# Patient Record
Sex: Male | Born: 1985
Health system: Southern US, Community
[De-identification: ages and names within clinical notes are randomized; demographics above are authoritative.]

## PROBLEM LIST (undated history)

## (undated) DIAGNOSIS — R011 Cardiac murmur, unspecified: Secondary | ICD-10-CM

## (undated) DIAGNOSIS — F988 Other specified behavioral and emotional disorders with onset usually occurring in childhood and adolescence: Secondary | ICD-10-CM

## (undated) DIAGNOSIS — G43909 Migraine, unspecified, not intractable, without status migrainosus: Secondary | ICD-10-CM

## (undated) DIAGNOSIS — Z87442 Personal history of urinary calculi: Secondary | ICD-10-CM

## (undated) DIAGNOSIS — J45909 Unspecified asthma, uncomplicated: Secondary | ICD-10-CM

## (undated) DIAGNOSIS — N200 Calculus of kidney: Secondary | ICD-10-CM

## (undated) HISTORY — PX: LITHOTRIPSY: SUR834

## (undated) HISTORY — PX: KIDNEY STONE SURGERY: SHX686

---
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
## 2004-09-26 ENCOUNTER — Emergency Department (HOSPITAL_COMMUNITY): Admission: EM | Admit: 2004-09-26 | Discharge: 2004-09-27 | Payer: Self-pay | Admitting: Emergency Medicine

## 2004-09-28 ENCOUNTER — Emergency Department (HOSPITAL_COMMUNITY): Admission: EM | Admit: 2004-09-28 | Discharge: 2004-09-28 | Payer: Self-pay | Admitting: Emergency Medicine

## 2013-04-29 DIAGNOSIS — J45909 Unspecified asthma, uncomplicated: Secondary | ICD-10-CM | POA: Insufficient documentation

## 2013-04-29 DIAGNOSIS — F988 Other specified behavioral and emotional disorders with onset usually occurring in childhood and adolescence: Secondary | ICD-10-CM | POA: Insufficient documentation

## 2016-09-15 DIAGNOSIS — R112 Nausea with vomiting, unspecified: Secondary | ICD-10-CM | POA: Diagnosis not present

## 2016-09-15 DIAGNOSIS — N1330 Unspecified hydronephrosis: Secondary | ICD-10-CM | POA: Diagnosis not present

## 2016-09-15 DIAGNOSIS — N132 Hydronephrosis with renal and ureteral calculous obstruction: Secondary | ICD-10-CM | POA: Diagnosis not present

## 2016-09-15 DIAGNOSIS — N201 Calculus of ureter: Secondary | ICD-10-CM | POA: Diagnosis not present

## 2016-09-15 DIAGNOSIS — Z79899 Other long term (current) drug therapy: Secondary | ICD-10-CM | POA: Diagnosis not present

## 2016-09-15 DIAGNOSIS — N23 Unspecified renal colic: Secondary | ICD-10-CM | POA: Diagnosis not present

## 2016-09-15 DIAGNOSIS — Z87891 Personal history of nicotine dependence: Secondary | ICD-10-CM | POA: Diagnosis not present

## 2016-09-15 DIAGNOSIS — Z880 Allergy status to penicillin: Secondary | ICD-10-CM | POA: Diagnosis not present

## 2016-09-15 DIAGNOSIS — K2970 Gastritis, unspecified, without bleeding: Secondary | ICD-10-CM | POA: Diagnosis not present

## 2016-09-15 DIAGNOSIS — E876 Hypokalemia: Secondary | ICD-10-CM | POA: Diagnosis not present

## 2016-09-29 DIAGNOSIS — N202 Calculus of kidney with calculus of ureter: Secondary | ICD-10-CM | POA: Diagnosis not present

## 2016-10-01 DIAGNOSIS — Z880 Allergy status to penicillin: Secondary | ICD-10-CM | POA: Diagnosis not present

## 2016-10-01 DIAGNOSIS — Z79899 Other long term (current) drug therapy: Secondary | ICD-10-CM | POA: Diagnosis not present

## 2016-10-01 DIAGNOSIS — J45909 Unspecified asthma, uncomplicated: Secondary | ICD-10-CM | POA: Diagnosis not present

## 2016-10-01 DIAGNOSIS — N201 Calculus of ureter: Secondary | ICD-10-CM | POA: Diagnosis not present

## 2016-10-01 DIAGNOSIS — Z466 Encounter for fitting and adjustment of urinary device: Secondary | ICD-10-CM | POA: Diagnosis not present

## 2016-10-01 DIAGNOSIS — Z79891 Long term (current) use of opiate analgesic: Secondary | ICD-10-CM | POA: Diagnosis not present

## 2016-10-04 DIAGNOSIS — R109 Unspecified abdominal pain: Secondary | ICD-10-CM | POA: Diagnosis not present

## 2016-10-04 DIAGNOSIS — Z79899 Other long term (current) drug therapy: Secondary | ICD-10-CM | POA: Diagnosis not present

## 2016-10-04 DIAGNOSIS — R300 Dysuria: Secondary | ICD-10-CM | POA: Diagnosis not present

## 2016-10-04 DIAGNOSIS — Z880 Allergy status to penicillin: Secondary | ICD-10-CM | POA: Diagnosis not present

## 2016-10-04 DIAGNOSIS — Z87891 Personal history of nicotine dependence: Secondary | ICD-10-CM | POA: Diagnosis not present

## 2016-10-06 DIAGNOSIS — N201 Calculus of ureter: Secondary | ICD-10-CM | POA: Diagnosis not present

## 2017-01-05 DIAGNOSIS — Z0389 Encounter for observation for other suspected diseases and conditions ruled out: Secondary | ICD-10-CM | POA: Diagnosis not present

## 2017-01-05 DIAGNOSIS — R109 Unspecified abdominal pain: Secondary | ICD-10-CM | POA: Diagnosis not present

## 2017-01-05 DIAGNOSIS — R1084 Generalized abdominal pain: Secondary | ICD-10-CM | POA: Diagnosis not present

## 2017-01-05 DIAGNOSIS — N23 Unspecified renal colic: Secondary | ICD-10-CM | POA: Diagnosis not present

## 2017-01-05 DIAGNOSIS — N200 Calculus of kidney: Secondary | ICD-10-CM | POA: Diagnosis not present

## 2017-01-08 DIAGNOSIS — N200 Calculus of kidney: Secondary | ICD-10-CM | POA: Diagnosis not present

## 2017-01-08 DIAGNOSIS — Z880 Allergy status to penicillin: Secondary | ICD-10-CM | POA: Diagnosis not present

## 2017-01-08 DIAGNOSIS — Z79899 Other long term (current) drug therapy: Secondary | ICD-10-CM | POA: Diagnosis not present

## 2017-06-10 DIAGNOSIS — R109 Unspecified abdominal pain: Secondary | ICD-10-CM | POA: Diagnosis not present

## 2017-06-10 DIAGNOSIS — N200 Calculus of kidney: Secondary | ICD-10-CM | POA: Diagnosis not present

## 2017-06-10 DIAGNOSIS — R1111 Vomiting without nausea: Secondary | ICD-10-CM | POA: Diagnosis not present

## 2017-06-10 DIAGNOSIS — R1084 Generalized abdominal pain: Secondary | ICD-10-CM | POA: Diagnosis not present

## 2017-06-25 DIAGNOSIS — N23 Unspecified renal colic: Secondary | ICD-10-CM | POA: Diagnosis not present

## 2017-06-25 DIAGNOSIS — R109 Unspecified abdominal pain: Secondary | ICD-10-CM | POA: Diagnosis not present

## 2017-06-29 ENCOUNTER — Emergency Department (HOSPITAL_BASED_OUTPATIENT_CLINIC_OR_DEPARTMENT_OTHER): Payer: BLUE CROSS/BLUE SHIELD

## 2017-06-29 ENCOUNTER — Encounter (HOSPITAL_BASED_OUTPATIENT_CLINIC_OR_DEPARTMENT_OTHER): Payer: Self-pay | Admitting: Emergency Medicine

## 2017-06-29 ENCOUNTER — Emergency Department (HOSPITAL_BASED_OUTPATIENT_CLINIC_OR_DEPARTMENT_OTHER)
Admission: EM | Admit: 2017-06-29 | Discharge: 2017-06-29 | Disposition: A | Discharge status: Home / Self Care | Payer: BLUE CROSS/BLUE SHIELD | Attending: Emergency Medicine | Admitting: Emergency Medicine

## 2017-06-29 ENCOUNTER — Other Ambulatory Visit: Payer: Self-pay

## 2017-06-29 DIAGNOSIS — Z87891 Personal history of nicotine dependence: Secondary | ICD-10-CM | POA: Insufficient documentation

## 2017-06-29 DIAGNOSIS — N200 Calculus of kidney: Secondary | ICD-10-CM | POA: Diagnosis not present

## 2017-06-29 DIAGNOSIS — R109 Unspecified abdominal pain: Secondary | ICD-10-CM

## 2017-06-29 DIAGNOSIS — R1011 Right upper quadrant pain: Secondary | ICD-10-CM | POA: Insufficient documentation

## 2017-06-29 DIAGNOSIS — M549 Dorsalgia, unspecified: Secondary | ICD-10-CM | POA: Diagnosis not present

## 2017-06-29 HISTORY — DX: Calculus of kidney: N20.0

## 2017-06-29 LAB — URINALYSIS, ROUTINE W REFLEX MICROSCOPIC
Bilirubin Urine: NEGATIVE
Glucose, UA: NEGATIVE mg/dL
Hgb urine dipstick: NEGATIVE
Ketones, ur: NEGATIVE mg/dL
Leukocytes, UA: NEGATIVE
Nitrite: NEGATIVE
Protein, ur: NEGATIVE mg/dL
Specific Gravity, Urine: 1.02 (ref 1.005–1.030)
pH: 6 (ref 5.0–8.0)

## 2017-06-29 LAB — CBC WITH DIFFERENTIAL/PLATELET
BASOS ABS: 0.1 10*3/uL (ref 0.0–0.1)
Basophils Relative: 2 %
Eosinophils Absolute: 0.4 10*3/uL (ref 0.0–0.7)
Eosinophils Relative: 11 %
HEMATOCRIT: 41.1 % (ref 39.0–52.0)
Hemoglobin: 14.1 g/dL (ref 13.0–17.0)
LYMPHS ABS: 1.5 10*3/uL (ref 0.7–4.0)
LYMPHS PCT: 43 %
MCH: 29.8 pg (ref 26.0–34.0)
MCHC: 34.3 g/dL (ref 30.0–36.0)
MCV: 86.9 fL (ref 78.0–100.0)
MONO ABS: 0.3 10*3/uL (ref 0.1–1.0)
Monocytes Relative: 10 %
NEUTROS ABS: 1.2 10*3/uL — AB (ref 1.7–7.7)
Neutrophils Relative %: 34 %
Platelets: 250 10*3/uL (ref 150–400)
RBC: 4.73 MIL/uL (ref 4.22–5.81)
RDW: 13.1 % (ref 11.5–15.5)
WBC: 3.4 10*3/uL — AB (ref 4.0–10.5)

## 2017-06-29 LAB — COMPREHENSIVE METABOLIC PANEL
ALT: 27 U/L (ref 17–63)
AST: 30 U/L (ref 15–41)
Albumin: 4.5 g/dL (ref 3.5–5.0)
Alkaline Phosphatase: 57 U/L (ref 38–126)
Anion gap: 7 (ref 5–15)
BUN: 14 mg/dL (ref 6–20)
CO2: 26 mmol/L (ref 22–32)
Calcium: 9.5 mg/dL (ref 8.9–10.3)
Chloride: 105 mmol/L (ref 101–111)
Creatinine, Ser: 0.81 mg/dL (ref 0.61–1.24)
GFR calc Af Amer: >60 mL/min (ref >60)
GFR calc non Af Amer: >60 mL/min (ref >60)
Glucose, Bld: 85 mg/dL (ref 65–99)
Potassium: 4.2 mmol/L (ref 3.5–5.1)
Sodium: 138 mmol/L (ref 135–145)
Total Bilirubin: 0.4 mg/dL (ref 0.3–1.2)
Total Protein: 7.5 g/dL (ref 6.5–8.1)

## 2017-06-29 LAB — LIPASE, BLOOD: LIPASE: 111 U/L — AB (ref 11–51)

## 2017-06-29 MED ORDER — IOPAMIDOL (ISOVUE-300) INJECTION 61%
100.0000 mL | Freq: Once | INTRAVENOUS | Status: AC | PRN
Start: 2017-06-29 — End: 2017-06-29
  Administered 2017-06-29: 100 mL via INTRAVENOUS

## 2017-06-29 MED ORDER — SODIUM CHLORIDE 0.9 % IV SOLN
INTRAVENOUS | Status: DC
  Administered 2017-06-29: 11:00:00 via INTRAVENOUS

## 2017-06-29 NOTE — Discharge Instructions (Signed)
Extensive workup for the right flank pain right CVA pain without any acute findings.  May be musculoskeletal in nature particularly by her history.  Recommend 1 week course of a medication like Naprosyn or Motrin.  Follow-up with your primary care doctor.  Return for any new or worse symptoms.

## 2017-06-29 NOTE — ED Provider Notes (Signed)
[facility] EMERGENCY DEPARTMENT Provider Note   CSN: [id number] Arrival date & time: 06/29/17  1014     History   Chief Complaint Chief Complaint  Patient presents with  . Flank Pain    HPI Worthy FlankDerek G Cisneros is a 32 y.o. male.  Patient with a complaint of right flank pain since Christmas time.  Has had a history of kidney stones in the past.  Evaluated by urology with CT renal study several weeks ago.  Without any evidence of any ureter stones.  Went to a walk-in clinic yesterday and they were concerned about gallbladder and recommended ultrasound of the gallbladder.  Patient states the pain is intermittent right flank right CVA sometimes right upper quadrant made worse by walking and made worse by sitting in the car.  Food has no effect on it.  No nausea no vomiting no diarrhea no fevers.  No history of similar pain prior to Christmas.      Past Medical History:  Diagnosis Date  . Kidney stones     There are no active problems to display for this patient.   Past Surgical History:  Procedure Laterality Date  . LITHOTRIPSY         Home Medications    Prior to Admission medications   Not on File    Family History No family history on file.  Social History Social History   Tobacco Use  . Smoking status: Former Games developermoker  . Smokeless tobacco: Never Used  Substance Use Topics  . Alcohol use: Yes    Frequency: Never    Comment: occ  . Drug use: No     Allergies   Amoxicillin and Penicillins   Review of Systems Review of Systems  Constitutional: Negative for fever.  HENT: Negative for congestion.   Eyes: Negative for redness.  Respiratory: Negative for shortness of breath.   Cardiovascular: Negative for chest pain.  Gastrointestinal: Positive for abdominal pain.  Genitourinary: Positive for flank pain. Negative for dysuria and hematuria.  Musculoskeletal: Positive for back pain.  Skin: Negative for rash.  Neurological: Negative for  headaches.  Hematological: Does not bruise/bleed easily.  Psychiatric/Behavioral: Negative for confusion.     Physical Exam Updated Vital Signs BP (!) 130/93 (BP Location: Right Arm)   Pulse 64   Temp 97.8 F (36.6 C) (Oral)   Resp 16   Ht 1.753 m (5\' 9" )   Wt 56 kg (123 lb 8 oz)   SpO2 100%   BMI 18.24 kg/m   Physical Exam  Constitutional: He is oriented to person, place, and time. He appears well-developed and well-nourished. No distress.  HENT:  Head: Normocephalic and atraumatic.  Mouth/Throat: Oropharynx is clear and moist.  Eyes: Conjunctivae and EOM are normal. Pupils are equal, round, and reactive to light.  Neck: Normal range of motion. Neck supple.  Cardiovascular: Normal rate, regular rhythm and normal heart sounds.  Pulmonary/Chest: Effort normal and breath sounds normal. No respiratory distress.  Abdominal: Soft. Bowel sounds are normal. There is no tenderness.  Musculoskeletal: Normal range of motion. He exhibits no tenderness.  No CVA tenderness no tenderness to palpation of low back.  Neurological: He is alert and oriented to person, place, and time. No cranial nerve deficit or sensory deficit. He exhibits normal muscle tone. Coordination normal.  Skin: Skin is warm. No rash noted.  Nursing note and vitals reviewed.    ED Treatments / Results  Labs (all labs ordered are listed, but only abnormal results are  displayed) Labs Reviewed  URINALYSIS, ROUTINE W REFLEX MICROSCOPIC  COMPREHENSIVE METABOLIC PANEL  LIPASE, BLOOD  CBC WITH DIFFERENTIAL/PLATELET    EKG  EKG Interpretation None       Radiology No results found.  Procedures Procedures (including critical care time)  Medications Ordered in ED Medications  0.9 %  sodium chloride infusion (not administered)     Initial Impression / Assessment and Plan / ED Course  I have reviewed the triage vital signs and the nursing notes.  Pertinent labs & imaging results that were available  during my care of the patient were reviewed by me and considered in my medical decision making (see chart for details).     Patient around Christmas time had CT renal study not showing any ureteral stones.  Does have a history of stones up in the kidney area.  Patient with persistent right flank right upper quadrant right CVA pain.  May be musculoskeletal in nature.  Patient went to walk-in clinic and they wanted to rule out gallbladder problems.  Symptoms not very consistent with gallbladder however we will go ahead and do CT abdomen pelvis with contrast here to further evaluate the entire area.  Extensive workup here to include CT of the abdomen.  Gallbladder very normal.  Nothing to explain the pain.  Patient's lipase was elevated on basic labs but CT shows no inflammation or abnormality patient symptoms are very muscular by history.  Would recommend a course of Motrin or Naprosyn for the next 7 days.  Patient has follow-up with a new primary care doctor available.  No evidence of ureteral stones no real GI symptoms associated with the pain.  Final Clinical Impressions(s) / ED Diagnoses   Final diagnoses:  None    ED Discharge Orders    None       Vanetta Mulders, MD 06/29/17 1252

## 2017-06-29 NOTE — ED Triage Notes (Signed)
Pain in right flank and RLQ for a month and a half.  Has seen urology.  Had CT.  Was told he had 2 small - 2mm- stones.  One in each kidney that "should not be hurting like this."  [facility] was told he needs an US to check his gall bladder.

## 2017-08-14 DIAGNOSIS — N200 Calculus of kidney: Secondary | ICD-10-CM | POA: Diagnosis not present

## 2017-08-14 DIAGNOSIS — M5431 Sciatica, right side: Secondary | ICD-10-CM | POA: Diagnosis not present

## 2017-11-13 DIAGNOSIS — F900 Attention-deficit hyperactivity disorder, predominantly inattentive type: Secondary | ICD-10-CM | POA: Diagnosis not present

## 2018-01-28 ENCOUNTER — Encounter (HOSPITAL_BASED_OUTPATIENT_CLINIC_OR_DEPARTMENT_OTHER): Payer: Self-pay | Admitting: Emergency Medicine

## 2018-01-28 ENCOUNTER — Other Ambulatory Visit: Payer: Self-pay

## 2018-01-28 ENCOUNTER — Emergency Department (HOSPITAL_BASED_OUTPATIENT_CLINIC_OR_DEPARTMENT_OTHER)
Admission: EM | Admit: 2018-01-28 | Discharge: 2018-01-28 | Disposition: A | Discharge status: Home / Self Care | Payer: BLUE CROSS/BLUE SHIELD | Attending: Emergency Medicine | Admitting: Emergency Medicine

## 2018-01-28 ENCOUNTER — Emergency Department (HOSPITAL_BASED_OUTPATIENT_CLINIC_OR_DEPARTMENT_OTHER): Payer: BLUE CROSS/BLUE SHIELD

## 2018-01-28 DIAGNOSIS — Z79899 Other long term (current) drug therapy: Secondary | ICD-10-CM | POA: Insufficient documentation

## 2018-01-28 DIAGNOSIS — R109 Unspecified abdominal pain: Secondary | ICD-10-CM

## 2018-01-28 DIAGNOSIS — R110 Nausea: Secondary | ICD-10-CM | POA: Diagnosis not present

## 2018-01-28 DIAGNOSIS — F17200 Nicotine dependence, unspecified, uncomplicated: Secondary | ICD-10-CM | POA: Insufficient documentation

## 2018-01-28 DIAGNOSIS — R3915 Urgency of urination: Secondary | ICD-10-CM | POA: Diagnosis not present

## 2018-01-28 DIAGNOSIS — R1031 Right lower quadrant pain: Secondary | ICD-10-CM | POA: Diagnosis not present

## 2018-01-28 HISTORY — DX: Other specified behavioral and emotional disorders with onset usually occurring in childhood and adolescence: F98.8

## 2018-01-28 LAB — CBC WITH DIFFERENTIAL/PLATELET
BASOS PCT: 1 %
Basophils Absolute: 0.1 10*3/uL (ref 0.0–0.1)
EOS ABS: 0.2 10*3/uL (ref 0.0–0.7)
EOS PCT: 5 %
HCT: 41.4 % (ref 39.0–52.0)
HEMOGLOBIN: 14.1 g/dL (ref 13.0–17.0)
LYMPHS ABS: 1.4 10*3/uL (ref 0.7–4.0)
Lymphocytes Relative: 31 %
MCH: 29.6 pg (ref 26.0–34.0)
MCHC: 34.1 g/dL (ref 30.0–36.0)
MCV: 87 fL (ref 78.0–100.0)
MONOS PCT: 7 %
Monocytes Absolute: 0.3 10*3/uL (ref 0.1–1.0)
NEUTROS PCT: 56 %
Neutro Abs: 2.5 10*3/uL (ref 1.7–7.7)
PLATELETS: 238 10*3/uL (ref 150–400)
RBC: 4.76 MIL/uL (ref 4.22–5.81)
RDW: 13.2 % (ref 11.5–15.5)
WBC: 4.5 10*3/uL (ref 4.0–10.5)

## 2018-01-28 LAB — URINALYSIS, ROUTINE W REFLEX MICROSCOPIC
BILIRUBIN URINE: NEGATIVE
Glucose, UA: NEGATIVE mg/dL
HGB URINE DIPSTICK: NEGATIVE
KETONES UR: NEGATIVE mg/dL
Leukocytes, UA: NEGATIVE
NITRITE: NEGATIVE
Protein, ur: NEGATIVE mg/dL
Specific Gravity, Urine: 1.02 (ref 1.005–1.030)
pH: 6.5 (ref 5.0–8.0)

## 2018-01-28 LAB — BASIC METABOLIC PANEL
Anion gap: 11 (ref 5–15)
BUN: 18 mg/dL (ref 6–20)
CALCIUM: 9.1 mg/dL (ref 8.9–10.3)
CHLORIDE: 107 mmol/L (ref 98–111)
CO2: 24 mmol/L (ref 22–32)
CREATININE: 0.69 mg/dL (ref 0.61–1.24)
GFR calc non Af Amer: >60 mL/min (ref >60)
Glucose, Bld: 88 mg/dL (ref 70–99)
Potassium: 4 mmol/L (ref 3.5–5.1)
SODIUM: 142 mmol/L (ref 135–145)

## 2018-01-28 MED ORDER — KETOROLAC TROMETHAMINE 30 MG/ML IJ SOLN
30.0000 mg | Freq: Once | INTRAMUSCULAR | Status: AC
  Administered 2018-01-28: 30 mg via INTRAVENOUS
  Filled 2018-01-28: qty 1

## 2018-01-28 NOTE — ED Triage Notes (Addendum)
Pt passed kidney stone on 8/11 and sts he has had bil flank pain ever since. Sts he has had urinary urgency x5-6 days to the point that he is having to wear adult diapers because he can't control it.  When it hits he gets "an intense pain."

## 2018-01-28 NOTE — ED Provider Notes (Signed)
[facility] EMERGENCY DEPARTMENT Provider Note   CSN: [id number] Arrival date & time: 01/28/18  0848     History   Chief Complaint Chief Complaint  Patient presents with  . Flank Pain    HPI Joe Cisneros is a 32 y.o. male.  Patient is a 32 year old male who presents with flank pain.  He has had a history of prior kidney stones.  He had lithotripsy a couple of years ago first stone that he had in [place].  He is previously been followed by [facility].  He states he saw a kidney stone the past on August 11 of this year.  At that point he was having some right flank pain.  He is continued to have some sharp pain in his right flank area that radiates to his right lower abdomen.  Now he is also having pain in his left flank.  He has had some severe urinary urgency and is having to wear an adult diaper because his urge to urinate comes on so suddenly.  He does feel like he is emptying his bladder well and has no difficulty getting his urine out.  He has some nausea but no vomiting.  No known fevers.  He has been taking hydrocodone at home with some improvement in symptoms.     Past Medical History:  Diagnosis Date  . ADD (attention deficit disorder)   . Kidney stones     There are no active problems to display for this patient.   Past Surgical History:  Procedure Laterality Date  . KIDNEY STONE SURGERY    . LITHOTRIPSY          Home Medications    Prior to Admission medications   Medication Sig Start Date End Date Taking? Authorizing Provider  amphetamine-dextroamphetamine (ADDERALL) 10 MG tablet Take 10 mg by mouth 2 (two) times daily with a meal.   Yes [provider]  tamsulosin (FLOMAX) 0.4 MG CAPS capsule Take 0.4 mg by mouth as needed.   Yes [provider]    Family History No family history on file.  Social History Social History   Tobacco Use  . Smoking status: Former Games developermoker  . Smokeless tobacco: Never Used    Substance Use Topics  . Alcohol use: Never    Frequency: Never  . Drug use: No     Allergies   Amoxicillin and Penicillins   Review of Systems Review of Systems  Constitutional: Negative for chills, diaphoresis, fatigue and fever.  HENT: Negative for congestion, rhinorrhea and sneezing.   Eyes: Negative.   Respiratory: Negative for cough, chest tightness and shortness of breath.   Cardiovascular: Negative for chest pain and leg swelling.  Gastrointestinal: Positive for abdominal pain and nausea. Negative for blood in stool, diarrhea and vomiting.  Genitourinary: Positive for flank pain and urgency. Negative for decreased urine volume, difficulty urinating, frequency and hematuria.  Musculoskeletal: Negative for arthralgias and back pain.  Skin: Negative for rash.  Neurological: Negative for dizziness, speech difficulty, weakness, numbness and headaches.     Physical Exam Updated Vital Signs BP (!) 127/99 (BP Location: Left Arm)   Pulse 69   Temp 97.6 F (36.4 C) (Oral)   Resp 14   Ht 5\' 10"  (1.778 m)   Wt 54.4 kg   SpO2 100%   BMI 17.22 kg/m   Physical Exam  Constitutional: He is oriented to person, place, and time. He appears well-developed and well-nourished.  HENT:  Head: Normocephalic and atraumatic.  Eyes: Pupils are equal, round, and reactive to light.  Neck: Normal range of motion. Neck supple.  Cardiovascular: Normal rate, regular rhythm and normal heart sounds.  Pulmonary/Chest: Effort normal and breath sounds normal. No respiratory distress. He has no wheezes. He has no rales. He exhibits no tenderness.  Abdominal: Soft. Bowel sounds are normal. There is tenderness (Tenderness to the lower abdomen bilaterally, mild bilateral CVA tenderness). There is no rebound and no guarding.  Musculoskeletal: Normal range of motion. He exhibits no edema.  Lymphadenopathy:    He has no cervical adenopathy.  Neurological: He is alert and oriented to person, place, and  time.  Skin: Skin is warm and dry. No rash noted.  Psychiatric: He has a normal mood and affect.     ED Treatments / Results  Labs (all labs ordered are listed, but only abnormal results are displayed) Labs Reviewed  URINALYSIS, ROUTINE W REFLEX MICROSCOPIC  BASIC METABOLIC PANEL  CBC WITH DIFFERENTIAL/PLATELET    EKG None  Radiology Ct Renal Stone Study  Result Date: 01/28/2018 CLINICAL DATA:  Right flank pain beginning 01/13/2018. Hematuria. Recently passed a stone. EXAM: CT ABDOMEN AND PELVIS WITHOUT CONTRAST TECHNIQUE: Multidetector CT imaging of the abdomen and pelvis was performed following the standard protocol without IV contrast. COMPARISON:  06/30/2017 FINDINGS: Lower chest: Normal Hepatobiliary: Normal Pancreas: Normal Spleen: Normal Adrenals/Urinary Tract: Adrenal glands are normal. Left kidney contains a 7 mm nonobstructing stone in the upper pole and a few other tiny calculi. Right kidney contains a few tiny nonobstructing stones. No stones seen in the right ureter or in the bladder. Stomach/Bowel: Normal.  Normal appearing appendix. Vascular/Lymphatic: Normal Reproductive: Normal Other: No free fluid or air. Musculoskeletal: Normal IMPRESSION: Multiple tiny nonobstructing renal calculi bilaterally. Nonobstructing 7 mm stone at the upper pole of left kidney. No evidence of hydroureteronephrosis. No passing stone. No stone in the bladder. Electronically Signed   By: Paulina Fusi M.D.   On: 01/28/2018 09:55    Procedures Procedures (including critical care time)  Medications Ordered in ED Medications  ketorolac (TORADOL) 30 MG/ML injection 30 mg (30 mg Intravenous Given 01/28/18 0938)     Initial Impression / Assessment and Plan / ED Course  I have reviewed the triage vital signs and the nursing notes.  Pertinent labs & imaging results that were available during my care of the patient were reviewed by me and considered in my medical decision making (see chart for  details).     Patient has no evidence of ureteral stones.  No evidence of infection.  His creatinine is normal.  There is no hydronephrosis.  He has no evidence of urinary retention.  He is able to fully empty his bladder on bladder scan.  He was discharged home in good condition.  He was encouraged to follow-up with [facility].  Return precautions were given.  Final Clinical Impressions(s) / ED Diagnoses   Final diagnoses:  Flank pain    ED Discharge Orders    None       Rolan Bucco, MD 01/28/18 1051

## 2018-01-28 NOTE — ED Notes (Signed)
Patient transported to CT 

## 2018-01-28 NOTE — ED Notes (Signed)
ED Provider at bedside. 

## 2018-03-16 DIAGNOSIS — E440 Moderate protein-calorie malnutrition: Secondary | ICD-10-CM | POA: Diagnosis not present

## 2018-03-16 DIAGNOSIS — Z23 Encounter for immunization: Secondary | ICD-10-CM | POA: Diagnosis not present

## 2018-03-16 DIAGNOSIS — R634 Abnormal weight loss: Secondary | ICD-10-CM | POA: Diagnosis not present

## 2018-03-16 DIAGNOSIS — F900 Attention-deficit hyperactivity disorder, predominantly inattentive type: Secondary | ICD-10-CM | POA: Diagnosis not present

## 2018-04-06 ENCOUNTER — Encounter: Payer: Self-pay | Admitting: Dietician

## 2018-04-06 ENCOUNTER — Telehealth: Payer: Self-pay | Admitting: Dietician

## 2018-04-06 ENCOUNTER — Encounter: Payer: BLUE CROSS/BLUE SHIELD | Attending: Family Medicine | Admitting: Dietician

## 2018-04-06 DIAGNOSIS — Z713 Dietary counseling and surveillance: Secondary | ICD-10-CM | POA: Diagnosis not present

## 2018-04-06 DIAGNOSIS — E440 Moderate protein-calorie malnutrition: Secondary | ICD-10-CM | POA: Diagnosis not present

## 2018-04-06 DIAGNOSIS — R634 Abnormal weight loss: Secondary | ICD-10-CM | POA: Diagnosis not present

## 2018-04-06 DIAGNOSIS — R636 Underweight: Secondary | ICD-10-CM

## 2018-04-06 NOTE — Progress Notes (Signed)
Medical Nutrition Therapy  Appt Start Time: 10:29am End Time: 11:32am   Primary concerns today: trouble gaining weight   NUTRITION ASSESSMENT   Anthropometrics  Weight: 111.4 lbs (per dr apt on 01-16-18) Height: 70 in (per pt)  BMI: 16.5 (per dr apt on 01-16-18)    Pt states he has always been thin and has difficulty gaining weight and maintaining gained weight. Pt states his weight may fluctuate by 2-3 lbs week to week but usually returns to his normal low weight. Pt states that family members on his mother's side of the family are also relatively thin.  Pt's goal: gain between 15-40 lbs.  Biochemical Data & Labs none   Clinical Medical Hx: ADHD Surgeries: lithotripsy (for kidney stones)  Allergies: none Medications: Adderall  Supplements: none  Psychosocial/Lifestyle Former smoker, currently vapes. Only drinks alcohol socially/on occasion. 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   24-Hr Dietary Recall  First Meal: [employer] (double egg, double bacon, on bun) + coffee w/ creamer  Snack: crackers or chips  Second Meal: fast food or homestyle restaurant  Snack: chips + dip / hummus Third Meal: meat (such as beef or chicken) + vegetables  Snack: cookies or cheesecake   Beverages: coffee w/ creamer, water, juice    Food & Nutrition Related Hx Dietary Hx: Pt states he would like to cook more at home, especially for dinner and having leftovers the next day for lunch. Pt states he is not a picky eater, is willing to try new foods, and likes all veggies except beets. Pt states he doesn't eat frozen/pre-prepared meals. Pt states he no longer drinks soft drinks or red bull energy drinks, which he previously consumed a lot of. Pt states he recently dealt with kidney stones and sickness, which likely limited his regular food intake. Pt states he is a "heavy eater" and that he can eat a lot of food (at a time and in general.) Pt states he is almost always hungry, despite the  frequency/amounts of food eaten. Pt states he would like to gain weight and maintain it. Pt states he would prefer food and supplements to achieve his goals vs medication (states he would like to discontinue taking Adderall.)    Estimated Daily Fluid Intake: >50 oz  GI / Other Notable Symptoms: none  Physical Activity  Current average weekly physical activity: work (60+ hours/week, not structured but on feet some), trying to get back into regular physical activity (weight baring such as push ups, sit ups, etc.)    Estimated Energy Needs Calories: 3000 Carbohydrate: 338g Protein: 187g Fat: 100g   NUTRITION  DIAGNOSIS  Underweight ([place]-3.1) related to increased energy needs as evidenced by BMI <18.5 (16.5), hunger, and medication that affects appetite (Adderall.)   NUTRITION INTERVENTION  Nutrition Education (E-1) on high calorie/protein diet including:   Energy balance: basic overview of balancing energy intake vs energy expenditure and other factors that affect weight.   Calories: dietary types (carbohydrates, fat, and protein), sources of each type, and ways to increase intake (consume more calorie-dense foods, liquid calories in addition to meals/snacks, frequency, etc.)    Meal/snack timing: incorporating meals/snacks more frequently throughout the day and always eating if hungry.   Balanced eating: consuming foods from all food groups to get adequate macro and micronutrients, incorporating foods to achieve both caloric and nutrient density.   Physical activity: noted the importance of including physical activity as part of a healthy lifestyle while doing so in moderation as to  not interfere with weight gain goals.    Handouts Provided Include   Suggestions for Increasing Calories and Protein  Learning Style & Readiness for Change Teaching method utilized: Visual & Auditory  Demonstrated degree of understanding via: Teach Back  Barriers to learning/adherence to lifestyle  change: none identified   MONITORING & EVALUATION Dietary intake, weekly physical activity, and weight.   Next Steps  Patient is to contact [facility] for follow-up appointment as needed/desired.

## 2018-04-06 NOTE — Patient Instructions (Addendum)
   Follow guidelines outlined on handout "Suggestions for Increasing Calories and Protein"  Utilize foods from the high calorie and high protein lists to incorporate into recipes/meals/snacks   Continue consuming a variety of foods from all food groups   Eat when you feel hungry, taking advantage of your appetite

## 2018-05-14 NOTE — Telephone Encounter (Signed)
Phone encounter opened for training purposes only. Pt was not contacted.

## 2018-06-29 DIAGNOSIS — F900 Attention-deficit hyperactivity disorder, predominantly inattentive type: Secondary | ICD-10-CM | POA: Diagnosis not present

## 2018-06-29 DIAGNOSIS — F411 Generalized anxiety disorder: Secondary | ICD-10-CM | POA: Diagnosis not present

## 2018-06-29 DIAGNOSIS — E440 Moderate protein-calorie malnutrition: Secondary | ICD-10-CM | POA: Diagnosis not present

## 2019-05-08 ENCOUNTER — Other Ambulatory Visit: Payer: Self-pay

## 2019-05-08 ENCOUNTER — Encounter (HOSPITAL_BASED_OUTPATIENT_CLINIC_OR_DEPARTMENT_OTHER): Payer: Self-pay | Admitting: Emergency Medicine

## 2019-05-08 ENCOUNTER — Emergency Department (HOSPITAL_BASED_OUTPATIENT_CLINIC_OR_DEPARTMENT_OTHER)
Admission: EM | Admit: 2019-05-08 | Discharge: 2019-05-08 | Disposition: A | Discharge status: Home / Self Care | Payer: BLUE CROSS/BLUE SHIELD | Attending: Emergency Medicine | Admitting: Emergency Medicine

## 2019-05-08 DIAGNOSIS — Z2914 Encounter for prophylactic rabies immune globin: Secondary | ICD-10-CM | POA: Diagnosis not present

## 2019-05-08 DIAGNOSIS — Y990 Civilian activity done for income or pay: Secondary | ICD-10-CM | POA: Insufficient documentation

## 2019-05-08 DIAGNOSIS — Y929 Unspecified place or not applicable: Secondary | ICD-10-CM | POA: Insufficient documentation

## 2019-05-08 DIAGNOSIS — W540XXA Bitten by dog, initial encounter: Secondary | ICD-10-CM | POA: Insufficient documentation

## 2019-05-08 DIAGNOSIS — S6992XA Unspecified injury of left wrist, hand and finger(s), initial encounter: Secondary | ICD-10-CM | POA: Insufficient documentation

## 2019-05-08 DIAGNOSIS — Y9389 Activity, other specified: Secondary | ICD-10-CM | POA: Insufficient documentation

## 2019-05-08 DIAGNOSIS — Z79899 Other long term (current) drug therapy: Secondary | ICD-10-CM | POA: Insufficient documentation

## 2019-05-08 DIAGNOSIS — S61452A Open bite of left hand, initial encounter: Secondary | ICD-10-CM | POA: Diagnosis not present

## 2019-05-08 DIAGNOSIS — Z23 Encounter for immunization: Secondary | ICD-10-CM | POA: Diagnosis not present

## 2019-05-08 DIAGNOSIS — Z87891 Personal history of nicotine dependence: Secondary | ICD-10-CM | POA: Diagnosis not present

## 2019-05-08 MED ORDER — TETANUS-DIPHTH-ACELL PERTUSSIS 5-2.5-18.5 LF-MCG/0.5 IM SUSP
0.5000 mL | Freq: Once | INTRAMUSCULAR | Status: AC
  Administered 2019-05-08: 0.5 mL via INTRAMUSCULAR
  Filled 2019-05-08: qty 0.5

## 2019-05-08 MED ORDER — RABIES IMMUNE GLOBULIN 150 UNIT/ML IM INJ
20.0000 [IU]/kg | INJECTION | Freq: Once | INTRAMUSCULAR | Status: AC
  Administered 2019-05-08: 1125 [IU] via INTRAMUSCULAR
  Filled 2019-05-08: qty 8

## 2019-05-08 MED ORDER — RABIES VACCINE, PCEC IM SUSR
1.0000 mL | Freq: Once | INTRAMUSCULAR | Status: AC
  Administered 2019-05-08: 1 mL via INTRAMUSCULAR
  Filled 2019-05-08: qty 1

## 2019-05-08 MED ORDER — DOXYCYCLINE HYCLATE 100 MG PO CAPS
100.0000 mg | ORAL_CAPSULE | Freq: Two times a day (BID) | ORAL | 0 refills | Status: DC
Start: 2019-05-08 — End: 2019-08-23

## 2019-05-08 NOTE — ED Notes (Signed)
                                  RABIES VACCINE FOLLOW UP  Patient's Name: Joe Cisneros                     Original Order Date:05/08/2019  Medical Record Number: [id number]  ED Physician: Charlesetta Shanks, MD Primary Diagnosis: Rabies Exposure       PCP: Kristen Loader, FNP  Patient Phone Number: (home) [phone] (home)    (cell)  Telephone Information:  Mobile [phone]    (work) There is no work phone number on file. Species of Animal:     You have been seen in the Emergency Department for a possible rabies exposure. It's very important you return for the additional vaccine doses.  Please call the clinic listed below for hours of operation.   Clinic that will administer your rabies vaccines:    DAY 0:  05/08/2019      DAY 3:  05/11/2019       DAY 7:  05/15/2019     DAY 14:  05/22/2019         The 5th vaccine injection is considered for immune compromised patients only.  DAY 28:  06/05/2019

## 2019-05-08 NOTE — ED Notes (Addendum)
Pt d/c home- EDP informed of RN of the need for rabies/tdap vaccinations. RN called pt and pt agreeable to plan and will return to facility.

## 2019-05-08 NOTE — Discharge Instructions (Addendum)
Please follow-up on the subsequent dates for your repeat rabies vaccine.  Take doxycycline as prescribed until completed.  Please have your wounds rechecked each time when you return for rabies vaccine.  I recommend using ice on your finger 15 minutes on, 15 minutes off.  Continue using Neosporin.  Please return the emergency department you develop increasing pain, redness, swelling, drainage, or any other concerning symptoms

## 2019-05-08 NOTE — ED Triage Notes (Signed)
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

## 2019-05-10 NOTE — ED Provider Notes (Signed)
[facility] EMERGENCY DEPARTMENT Provider Note   CSN: [id number] Arrival date & time: 05/08/19  1617     History   Chief Complaint Chief Complaint  Patient presents with  . Animal Bite    HPI Joe Cisneros is a 33 y.o. 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  Patient reports a customers dog bit him in the left hand.  He reports he has not had much pain, but noticed his left pinky finger was turning purple today so he went to get checked out.  The dog's vaccines were not up-to-date.  Tetanus is not up-to-date.  No interventions taken prior to arrival.  Patient was wearing gloves when the incident happened.     HPI  Past Medical History:  Diagnosis Date  . ADD (attention deficit disorder)   . Kidney stones     There are no active problems to display for this patient.   Past Surgical History:  Procedure Laterality Date  . KIDNEY STONE SURGERY    . LITHOTRIPSY          Home Medications    Prior to Admission medications   Medication Sig Start Date End Date Taking? Authorizing Provider  amphetamine-dextroamphetamine (ADDERALL) 10 MG tablet Take 10 mg by mouth 2 (two) times daily with a meal.    [provider]  doxycycline (VIBRAMYCIN) 100 MG capsule Take 1 capsule (100 mg total) by mouth 2 (two) times daily. 05/08/19   Sky Borboa, Waylan Boga, PA-C  tamsulosin (FLOMAX) 0.4 MG CAPS capsule Take 0.4 mg by mouth as needed.    [provider]    Family History No family history on file.  Social History Social History   Tobacco Use  . Smoking status: Former Games developer  . Smokeless tobacco: Never Used  Substance Use Topics  . Alcohol use: Never    Frequency: Never    Comment: socially   . Drug use: No     Allergies   Amoxicillin and Penicillins   Review of Systems Review of Systems  Constitutional: Negative for fever.  Skin:  Positive for wound.     Physical Exam Updated Vital Signs BP (!) 130/93 (BP Location: Right Arm)   Pulse 61   Temp 98.3 F (36.8 C) (Oral)   Resp 20   Ht 5\' 10"  (1.778 m)   Wt 54.4 kg   SpO2 100%   BMI 17.22 kg/m   Physical Exam Vitals signs and nursing note reviewed.  Constitutional:      General: He is not in acute distress.    Appearance: He is well-developed. He is not diaphoretic.  HENT:     Head: Normocephalic and atraumatic.     Mouth/Throat:     Pharynx: No oropharyngeal exudate.  Eyes:     General: No scleral icterus.       Right eye: No discharge.        Left eye: No discharge.     Conjunctiva/sclera: Conjunctivae normal.     Pupils: Pupils are equal, round, and reactive to light.  Neck:     Musculoskeletal: Normal range of motion and neck supple.     Thyroid: No thyromegaly.  Cardiovascular:     Rate and Rhythm: Normal rate and regular rhythm.     Heart sounds: Normal heart sounds. No murmur. No friction rub. No gallop.   Pulmonary:     Effort: Pulmonary effort  is normal. No respiratory distress.     Breath sounds: Normal breath sounds. No stridor. No wheezing or rales.  Lymphadenopathy:     Cervical: No cervical adenopathy.  Skin:    General: Skin is warm and dry.     Coloration: Skin is not pale.     Findings: No rash.     Comments: Superficial wounds to the dorsum of the left hand; small puncture wound to the left fifth digit distal to the DIP; see photos Full range of motion of the digits without difficulty; no erythema, drainage of the wounds  Neurological:     Mental Status: He is alert.     Coordination: Coordination normal.          ED Treatments / Results  Labs (all labs ordered are listed, but only abnormal results are displayed) Labs Reviewed - No data to display  EKG None  Radiology No results found.  Procedures Procedures (including critical care time)  Medications Ordered in ED Medications  rabies immune globulin  (HYPERAB/KEDRAB) injection 1,125 Units (1,125 Units Intramuscular Given 05/08/19 2257)  rabies vaccine (RABAVERT) injection 1 mL (1 mL Intramuscular Given 05/08/19 2256)  Tdap (BOOSTRIX) injection 0.5 mL (0.5 mLs Intramuscular Given 05/08/19 2238)     Initial Impression / Assessment and Plan / ED Course  I have reviewed the triage vital signs and the nursing notes.  Pertinent labs & imaging results that were available during my care of the patient were reviewed by me and considered in my medical decision making (see chart for details).        Patient with superficial dog bites to the left hand.  Tetanus updated.  Rabies vaccine and immunoglobulin given.  Will start doxycycline considering patient's reported allergy to penicillins.  Advised wound check at subsequent rabies vaccine visits.  Return precautions discussed.  Patient vitals stable throughout ED course and discharged in satisfactory condition.  Final Clinical Impressions(s) / ED Diagnoses   Final diagnoses:  Dog bite, initial encounter    ED Discharge Orders         Ordered    doxycycline (VIBRAMYCIN) 100 MG capsule  2 times daily     05/08/19 2035           Frederica Kuster, PA-C 05/10/19 1025    Charlesetta Shanks, MD 05/24/19 [phone]

## 2019-05-11 ENCOUNTER — Emergency Department (INDEPENDENT_AMBULATORY_CARE_PROVIDER_SITE_OTHER)
Admission: EM | Admit: 2019-05-11 | Discharge: 2019-05-11 | Disposition: A | Discharge status: Home / Self Care | Payer: BLUE CROSS/BLUE SHIELD | Source: Home / Self Care

## 2019-05-11 ENCOUNTER — Encounter: Payer: Self-pay | Admitting: Emergency Medicine

## 2019-05-11 ENCOUNTER — Other Ambulatory Visit: Payer: Self-pay

## 2019-05-11 DIAGNOSIS — Z299 Encounter for prophylactic measures, unspecified: Secondary | ICD-10-CM

## 2019-05-11 DIAGNOSIS — Z23 Encounter for immunization: Secondary | ICD-10-CM | POA: Diagnosis not present

## 2019-05-11 MED ORDER — RABIES VACCINE, PCEC IM SUSR
1.0000 mL | Freq: Once | INTRAMUSCULAR | Status: AC
  Administered 2019-05-11: 09:00:00 1 mL via INTRAMUSCULAR

## 2019-05-11 NOTE — ED Triage Notes (Signed)
Rabies injection 

## 2019-06-30 DIAGNOSIS — Z20828 Contact with and (suspected) exposure to other viral communicable diseases: Secondary | ICD-10-CM | POA: Diagnosis not present

## 2019-06-30 DIAGNOSIS — F900 Attention-deficit hyperactivity disorder, predominantly inattentive type: Secondary | ICD-10-CM | POA: Diagnosis not present

## 2019-06-30 DIAGNOSIS — R05 Cough: Secondary | ICD-10-CM | POA: Diagnosis not present

## 2019-06-30 DIAGNOSIS — J029 Acute pharyngitis, unspecified: Secondary | ICD-10-CM | POA: Diagnosis not present

## 2019-06-30 DIAGNOSIS — Z03818 Encounter for observation for suspected exposure to other biological agents ruled out: Secondary | ICD-10-CM | POA: Diagnosis not present

## 2019-06-30 DIAGNOSIS — F411 Generalized anxiety disorder: Secondary | ICD-10-CM | POA: Diagnosis not present

## 2019-08-23 ENCOUNTER — Other Ambulatory Visit: Payer: Self-pay

## 2019-08-23 ENCOUNTER — Ambulatory Visit (INDEPENDENT_AMBULATORY_CARE_PROVIDER_SITE_OTHER): Payer: BLUE CROSS/BLUE SHIELD

## 2019-08-23 ENCOUNTER — Other Ambulatory Visit: Payer: Self-pay | Admitting: Podiatry

## 2019-08-23 ENCOUNTER — Ambulatory Visit: Payer: BLUE CROSS/BLUE SHIELD | Admitting: Podiatry

## 2019-08-23 ENCOUNTER — Encounter: Payer: Self-pay | Admitting: Podiatry

## 2019-08-23 VITALS — BP 164/69 | HR 73 | Temp 96.7°F | Resp 16

## 2019-08-23 DIAGNOSIS — G5781 Other specified mononeuropathies of right lower limb: Secondary | ICD-10-CM

## 2019-08-23 DIAGNOSIS — G5761 Lesion of plantar nerve, right lower limb: Secondary | ICD-10-CM

## 2019-08-23 DIAGNOSIS — M778 Other enthesopathies, not elsewhere classified: Secondary | ICD-10-CM

## 2019-08-23 NOTE — Progress Notes (Signed)
  Subjective:  Patient ID: Joe Cisneros, male    DOB: 12-06-1985,  MRN: [id number] HPI Chief Complaint  Patient presents with  . 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 New Patient (Initial Visit)    34 y.o. male presents with the above complaint.   ROS: Denies fever chills nausea vomiting muscle aches pains calf pain back pain chest pain shortness of breath.  Past Medical History:  Diagnosis Date  . ADD (attention deficit disorder)   . Kidney stones    Past Surgical History:  Procedure Laterality Date  . KIDNEY STONE SURGERY    . LITHOTRIPSY      Current Outpatient Medications:  .  amphetamine-dextroamphetamine (ADDERALL) 10 MG tablet, Take 10 mg by mouth 2 (two) times daily with a meal., Disp: , Rfl:  .  sertraline (ZOLOFT) 50 MG tablet, Take 50 mg by mouth daily., Disp: , Rfl:   Allergies  Allergen Reactions  . Amoxicillin     As a baby.  "Have not tried it since."   . Penicillins    Review of Systems Objective:   Vitals:   08/23/19 0954  BP: (!) 164/69  Pulse: 73  Resp: 16  Temp: (!) 96.7 F (35.9 C)    General: Well developed, nourished, in no acute distress, alert and oriented x3   Dermatological: Skin is warm, dry and supple bilateral. Nails x 10 are well maintained; remaining integument appears unremarkable at this time. There are no open sores, no preulcerative lesions, no rash or signs of infection present.  Vascular: Dorsalis Pedis artery and Posterior Tibial artery pedal pulses are 2/4 bilateral with immedate capillary fill time. Pedal hair growth present. No varicosities and no lower extremity edema present bilateral.   Neruologic: Grossly intact via light touch bilateral. Vibratory intact via tuning fork bilateral. Protective threshold with Semmes Wienstein  monofilament intact to all pedal sites bilateral. Patellar and Achilles deep tendon reflexes 2+ bilateral. No Babinski or clonus noted bilateral.  Palpable Mulder's click third interdigital space of the right foot with radiating symptoms proximally and distally.  Musculoskeletal: No gross boney pedal deformities bilateral. No pain, crepitus, or limitation noted with foot and ankle range of motion bilateral. Muscular strength 5/5 in all groups tested bilateral.  Gait: Unassisted, Nonantalgic.    Radiographs:  Radiographs taken today do not demonstrate any type of significant osseous abnormalities no acute findings are noted.  Mildly elongated second and third metatarsals.  Osseously mature individual.  Assessment & Plan:   Assessment: Probable neuroma third interdigital space of the right foot  Plan: Discussed etiology pathology conservative surgical therapy spent a lot of time talking about shoe gear and his particular profession and what type of shoe gear to purchase.  We also injected the area today with 20 mg Kenalog 5 mg Marcaine point of maximal tenderness third interdigital space of the right foot.  I will follow-up with him in 1 month.  May need to consider orthotics.     Airam Heidecker T. [place], [place]

## 2019-09-27 ENCOUNTER — Ambulatory Visit: Payer: BLUE CROSS/BLUE SHIELD | Admitting: Podiatry

## 2019-10-13 DIAGNOSIS — F900 Attention-deficit hyperactivity disorder, predominantly inattentive type: Secondary | ICD-10-CM | POA: Diagnosis not present

## 2019-10-13 DIAGNOSIS — J4520 Mild intermittent asthma, uncomplicated: Secondary | ICD-10-CM | POA: Diagnosis not present

## 2019-10-13 DIAGNOSIS — M25562 Pain in left knee: Secondary | ICD-10-CM | POA: Diagnosis not present

## 2020-02-02 DIAGNOSIS — R079 Chest pain, unspecified: Secondary | ICD-10-CM | POA: Diagnosis not present

## 2020-02-02 DIAGNOSIS — R062 Wheezing: Secondary | ICD-10-CM | POA: Diagnosis not present

## 2020-02-02 DIAGNOSIS — Z03818 Encounter for observation for suspected exposure to other biological agents ruled out: Secondary | ICD-10-CM | POA: Diagnosis not present

## 2020-02-06 ENCOUNTER — Emergency Department (HOSPITAL_COMMUNITY)
Admission: EM | Admit: 2020-02-06 | Discharge: 2020-02-06 | Disposition: A | Discharge status: Home / Self Care | Payer: BLUE CROSS/BLUE SHIELD | Attending: Emergency Medicine | Admitting: Emergency Medicine

## 2020-02-06 ENCOUNTER — Other Ambulatory Visit: Payer: Self-pay

## 2020-02-06 ENCOUNTER — Emergency Department (HOSPITAL_COMMUNITY): Payer: BLUE CROSS/BLUE SHIELD

## 2020-02-06 ENCOUNTER — Encounter (HOSPITAL_COMMUNITY): Payer: Self-pay

## 2020-02-06 DIAGNOSIS — Z87891 Personal history of nicotine dependence: Secondary | ICD-10-CM | POA: Insufficient documentation

## 2020-02-06 DIAGNOSIS — J45909 Unspecified asthma, uncomplicated: Secondary | ICD-10-CM | POA: Diagnosis not present

## 2020-02-06 DIAGNOSIS — R52 Pain, unspecified: Secondary | ICD-10-CM | POA: Diagnosis not present

## 2020-02-06 DIAGNOSIS — N200 Calculus of kidney: Secondary | ICD-10-CM | POA: Insufficient documentation

## 2020-02-06 DIAGNOSIS — N2882 Megaloureter: Secondary | ICD-10-CM | POA: Diagnosis not present

## 2020-02-06 DIAGNOSIS — F909 Attention-deficit hyperactivity disorder, unspecified type: Secondary | ICD-10-CM | POA: Diagnosis not present

## 2020-02-06 DIAGNOSIS — R110 Nausea: Secondary | ICD-10-CM | POA: Diagnosis not present

## 2020-02-06 DIAGNOSIS — N1330 Unspecified hydronephrosis: Secondary | ICD-10-CM | POA: Diagnosis not present

## 2020-02-06 DIAGNOSIS — Z79899 Other long term (current) drug therapy: Secondary | ICD-10-CM | POA: Diagnosis not present

## 2020-02-06 DIAGNOSIS — R0902 Hypoxemia: Secondary | ICD-10-CM | POA: Diagnosis not present

## 2020-02-06 DIAGNOSIS — R109 Unspecified abdominal pain: Secondary | ICD-10-CM | POA: Diagnosis not present

## 2020-02-06 DIAGNOSIS — N201 Calculus of ureter: Secondary | ICD-10-CM | POA: Diagnosis not present

## 2020-02-06 LAB — CBC WITH DIFFERENTIAL/PLATELET
Abs Immature Granulocytes: 0.04 10*3/uL (ref 0.00–0.07)
Basophils Absolute: 0 10*3/uL (ref 0.0–0.1)
Basophils Relative: 0 %
Eosinophils Absolute: 0 10*3/uL (ref 0.0–0.5)
Eosinophils Relative: 0 %
HCT: 41.2 % (ref 39.0–52.0)
Hemoglobin: 13.3 g/dL (ref 13.0–17.0)
Immature Granulocytes: 0 %
Lymphocytes Relative: 11 %
Lymphs Abs: 1.1 10*3/uL (ref 0.7–4.0)
MCH: 29.4 pg (ref 26.0–34.0)
MCHC: 32.3 g/dL (ref 30.0–36.0)
MCV: 91.2 fL (ref 80.0–100.0)
Monocytes Absolute: 0.6 10*3/uL (ref 0.1–1.0)
Monocytes Relative: 6 %
Neutro Abs: 7.7 10*3/uL (ref 1.7–7.7)
Neutrophils Relative %: 83 %
Platelets: 283 10*3/uL (ref 150–400)
RBC: 4.52 MIL/uL (ref 4.22–5.81)
RDW: 13.6 % (ref 11.5–15.5)
WBC: 9.4 10*3/uL (ref 4.0–10.5)
nRBC: 0 % (ref 0.0–0.2)

## 2020-02-06 LAB — URINALYSIS, ROUTINE W REFLEX MICROSCOPIC
Bilirubin Urine: NEGATIVE
Glucose, UA: NEGATIVE mg/dL
Ketones, ur: NEGATIVE mg/dL
Leukocytes,Ua: NEGATIVE
Nitrite: NEGATIVE
Protein, ur: 30 mg/dL — AB
RBC / HPF: >50 RBC/hpf — ABNORMAL HIGH (ref 0–5)
Specific Gravity, Urine: 1.019 (ref 1.005–1.030)
pH: 7 (ref 5.0–8.0)

## 2020-02-06 LAB — BASIC METABOLIC PANEL
Anion gap: 12 (ref 5–15)
BUN: 19 mg/dL (ref 6–20)
CO2: 27 mmol/L (ref 22–32)
Calcium: 9.3 mg/dL (ref 8.9–10.3)
Chloride: 106 mmol/L (ref 98–111)
Creatinine, Ser: 0.91 mg/dL (ref 0.61–1.24)
GFR calc Af Amer: >60 mL/min (ref >60)
GFR calc non Af Amer: >60 mL/min (ref >60)
Glucose, Bld: 123 mg/dL — ABNORMAL HIGH (ref 70–99)
Potassium: 4.4 mmol/L (ref 3.5–5.1)
Sodium: 145 mmol/L (ref 135–145)

## 2020-02-06 MED ORDER — ONDANSETRON 8 MG PO TBDP: ORAL_TABLET | ORAL | 0 refills | Status: DC

## 2020-02-06 MED ORDER — MORPHINE SULFATE (PF) 4 MG/ML IV SOLN
4.0000 mg | Freq: Once | INTRAVENOUS | Status: AC
  Administered 2020-02-06: 4 mg via INTRAVENOUS
  Filled 2020-02-06: qty 1

## 2020-02-06 MED ORDER — TAMSULOSIN HCL 0.4 MG PO CAPS: 0.4000 mg | ORAL_CAPSULE | Freq: Every day | ORAL | 0 refills | Status: AC

## 2020-02-06 MED ORDER — OXYCODONE-ACETAMINOPHEN 5-325 MG PO TABS: 1.0000 | ORAL_TABLET | Freq: Four times a day (QID) | ORAL | 0 refills | Status: DC | PRN

## 2020-02-06 MED ORDER — ONDANSETRON HCL 4 MG/2ML IJ SOLN
4.0000 mg | Freq: Once | INTRAMUSCULAR | Status: AC
  Administered 2020-02-06: 4 mg via INTRAVENOUS
  Filled 2020-02-06: qty 2

## 2020-02-06 MED ORDER — KETOROLAC TROMETHAMINE 30 MG/ML IJ SOLN
30.0000 mg | Freq: Once | INTRAMUSCULAR | Status: AC
  Administered 2020-02-06: 30 mg via INTRAVENOUS
  Filled 2020-02-06: qty 1

## 2020-02-06 NOTE — Discharge Instructions (Signed)
Begin taking Percocet as prescribed as needed for pain.  Take Flomax once daily as prescribed.  Follow-up with [facility] if the stone has not passed in the next few days, and return to the ER if you develop worsening pain, high fever, or other new and concerning symptoms.  The number for the [facility] office has been provided in this discharge summary for you to call and make follow-up arrangements.

## 2020-02-06 NOTE — ED Provider Notes (Signed)
[facility]-EMERGENCY DEPT Provider Note   CSN: [id number] Arrival date & time: 02/06/20  9371     History Chief Complaint  Patient presents with  . Flank Pain    Joe Cisneros is a 34 y.o. male.  Patient is a 34 year old male with past medical history of kidney stones and ADHD.  He presents today for evaluation of left flank pain.  This woke him from sleep this morning at approximately 6 AM and has been constant.  He describes pain that radiates to his left back.  He denies any fevers or chills.  He denies bloody stool or hematuria.  He does report an episode of nausea and vomiting.  The history is provided by the patient.  Flank Pain This is a new problem. Episode onset: 6 AM. The problem occurs constantly. The problem has not changed since onset.Associated symptoms include abdominal pain. Exacerbated by: Movement and palpation. Nothing relieves the symptoms. He has tried nothing for the symptoms.       Past Medical History:  Diagnosis Date  . ADD (attention deficit disorder)   . Kidney stones     Patient Active Problem List   Diagnosis Date Noted  . ADD (attention deficit disorder) 04/29/2013  . Asthma 04/29/2013    Past Surgical History:  Procedure Laterality Date  . KIDNEY STONE SURGERY    . LITHOTRIPSY         History reviewed. No pertinent family history.  Social History   Tobacco Use  . Smoking status: Former Games developer  . Smokeless tobacco: Never Used  Vaping Use  . Vaping Use: Every day  Substance Use Topics  . Alcohol use: Never    Comment: socially   . Drug use: No    Home Medications Prior to Admission medications   Medication Sig Start Date End Date Taking? Authorizing Provider  amphetamine-dextroamphetamine (ADDERALL) 10 MG tablet Take 10 mg by mouth 2 (two) times daily with a meal.    [provider]  sertraline (ZOLOFT) 50 MG tablet Take 50 mg by mouth daily. 06/30/19   [provider]    Allergies     Amoxicillin and Penicillins  Review of Systems   Review of Systems  Gastrointestinal: Positive for abdominal pain.  Genitourinary: Positive for flank pain.  All other systems reviewed and are negative.   Physical Exam Updated Vital Signs BP 136/72   Pulse (!) 56   Resp (!) 22   SpO2 (!) 22%   Physical Exam Vitals and nursing note reviewed.  Constitutional:      General: He is not in acute distress.    Appearance: He is well-developed. He is not diaphoretic.  HENT:     Head: Normocephalic and atraumatic.  Cardiovascular:     Rate and Rhythm: Normal rate and regular rhythm.     Heart sounds: No murmur heard.  No friction rub.  Pulmonary:     Effort: Pulmonary effort is normal. No respiratory distress.     Breath sounds: Normal breath sounds. No wheezing or rales.  Abdominal:     General: Bowel sounds are normal. There is no distension.     Palpations: Abdomen is soft.     Tenderness: There is abdominal tenderness. There is left CVA tenderness. There is no right CVA tenderness, guarding or rebound.     Comments: There is tenderness to palpation in the left lower quadrant and left flank.  Musculoskeletal:        General: Normal range of  motion.     Cervical back: Normal range of motion and neck supple.  Skin:    General: Skin is warm and dry.  Neurological:     Mental Status: He is alert and oriented to person, place, and time.     Coordination: Coordination normal.     ED Results / Procedures / Treatments   Labs (all labs ordered are listed, but only abnormal results are displayed) Labs Reviewed  BASIC METABOLIC PANEL  CBC WITH DIFFERENTIAL/PLATELET    EKG None  Radiology No results found.  Procedures Procedures (including critical care time)  Medications Ordered in ED Medications  ondansetron (ZOFRAN) injection 4 mg (has no administration in time range)  ketorolac (TORADOL) 30 MG/ML injection 30 mg (has no administration in time range)  morphine 4  MG/ML injection 4 mg (has no administration in time range)    ED Course  I have reviewed the triage vital signs and the nursing notes.  Pertinent labs & imaging results that were available during my care of the patient were reviewed by me and considered in my medical decision making (see chart for details).    MDM Rules/Calculators/A&P  Patient presenting here with complaints of left flank pain that appears to be caused by a kidney stone.  CT scan shows a 3 mm calculus in the proximal ureter.  Patient feeling better after receiving medications here in the ER.  He has no fever, no white count, normal vital signs, and no fever.  Urinalysis is clear.  Patient to be discharged with pain medicine, Flomax, and follow-up with urology if stone is not passing in the next few days.  Final Clinical Impression(s) / ED Diagnoses Final diagnoses:  None    Rx / DC Orders ED Discharge Orders    None       Geoffery Lyons, MD 02/06/20 1145

## 2020-02-06 NOTE — ED Notes (Signed)
Mother @ bedside.

## 2020-02-06 NOTE — ED Triage Notes (Signed)
Pt came from home via EMS C/c Left flank pain started this AM awoke from sleep. Pain level 10/10. Difficulty urinating, denies hematuria.  2 doses of IM fentanyl, 2 doses IV. Total: 200 mcg

## 2020-02-06 NOTE — ED Notes (Signed)
Pt gone to CT 

## 2020-02-10 DIAGNOSIS — N201 Calculus of ureter: Secondary | ICD-10-CM | POA: Diagnosis not present

## 2020-02-17 DIAGNOSIS — N201 Calculus of ureter: Secondary | ICD-10-CM | POA: Diagnosis not present

## 2020-02-26 ENCOUNTER — Emergency Department (HOSPITAL_COMMUNITY)
Admission: EM | Admit: 2020-02-26 | Discharge: 2020-02-26 | Disposition: A | Discharge status: Home / Self Care | Payer: BLUE CROSS/BLUE SHIELD | Attending: Emergency Medicine | Admitting: Emergency Medicine

## 2020-02-26 ENCOUNTER — Other Ambulatory Visit: Payer: Self-pay

## 2020-02-26 ENCOUNTER — Encounter (HOSPITAL_COMMUNITY): Payer: Self-pay | Admitting: *Deleted

## 2020-02-26 DIAGNOSIS — J45909 Unspecified asthma, uncomplicated: Secondary | ICD-10-CM | POA: Diagnosis not present

## 2020-02-26 DIAGNOSIS — Z7951 Long term (current) use of inhaled steroids: Secondary | ICD-10-CM | POA: Diagnosis not present

## 2020-02-26 DIAGNOSIS — R109 Unspecified abdominal pain: Secondary | ICD-10-CM | POA: Insufficient documentation

## 2020-02-26 DIAGNOSIS — Z87891 Personal history of nicotine dependence: Secondary | ICD-10-CM | POA: Diagnosis not present

## 2020-02-26 LAB — URINALYSIS, ROUTINE W REFLEX MICROSCOPIC
Bacteria, UA: NONE SEEN
Bilirubin Urine: NEGATIVE
Glucose, UA: NEGATIVE mg/dL
Ketones, ur: NEGATIVE mg/dL
Nitrite: NEGATIVE
Protein, ur: NEGATIVE mg/dL
Specific Gravity, Urine: 1.011 (ref 1.005–1.030)
pH: 6 (ref 5.0–8.0)

## 2020-02-26 MED ORDER — ONDANSETRON 8 MG PO TBDP: 8.0000 mg | ORAL_TABLET | Freq: Once | ORAL | Status: DC

## 2020-02-26 MED ORDER — SODIUM CHLORIDE 0.9 % IV BOLUS
1000.0000 mL | Freq: Once | INTRAVENOUS | Status: AC
  Administered 2020-02-26: 1000 mL via INTRAVENOUS

## 2020-02-26 MED ORDER — FENTANYL CITRATE (PF) 100 MCG/2ML IJ SOLN
50.0000 ug | Freq: Once | INTRAMUSCULAR | Status: AC
  Administered 2020-02-26: 50 ug via INTRAVENOUS
  Filled 2020-02-26: qty 2

## 2020-02-26 MED ORDER — KETOROLAC TROMETHAMINE 30 MG/ML IJ SOLN
30.0000 mg | Freq: Once | INTRAMUSCULAR | Status: AC
  Administered 2020-02-26: 30 mg via INTRAVENOUS
  Filled 2020-02-26: qty 1

## 2020-02-26 NOTE — ED Triage Notes (Signed)
Woke about 5 am with left flank pain, can not void, dribbled a little about 0700, vomiting and pain, has seen urology since last visit due to stones.

## 2020-02-26 NOTE — Discharge Instructions (Addendum)
Please call your urologist for follow up tomorrow

## 2020-02-26 NOTE — ED Provider Notes (Signed)
[facility]-EMERGENCY DEPT Provider Note   CSN: [id number] Arrival date & time: 02/26/20  5621     History Chief Complaint  Patient presents with  . Flank Pain    Joe Cisneros is a 34 y.o. male.  HPI   34 yo male ho kidney stones, most recent dg 8/30 with 3 mm proximal left ureteral stone.  Patient state he has had intermittent ongoing pain.  He is followed by urology.  He states it has been 3 times.  He continues to take his Flomax.  Today he awoke in the early morning hours to severe pain.  He took Percocet prescribed here and has been having some.  He continues to have significant left flank pain although improved at this time.  He reports normal urination, no fevers, nausea but no active vomiting.  Past Medical History:  Diagnosis Date  . ADD (attention deficit disorder)   . Kidney stones     Patient Active Problem List   Diagnosis Date Noted  . ADD (attention deficit disorder) 04/29/2013  . Asthma 04/29/2013    Past Surgical History:  Procedure Laterality Date  . KIDNEY STONE SURGERY    . LITHOTRIPSY         No family history on file.  Social History   Tobacco Use  . Smoking status: Former Games developer  . Smokeless tobacco: Never Used  Vaping Use  . Vaping Use: Every day  Substance Use Topics  . Alcohol use: Never    Comment: socially   . Drug use: No    Home Medications Prior to Admission medications   Medication Sig Start Date End Date Taking? Authorizing Provider  ADDERALL XR 20 MG 24 hr capsule Take 30 mg by mouth daily as needed (for work). 20mg  capsule with 10mg  capsule 01/11/20   [provider]  albuterol (VENTOLIN HFA) 108 (90 Base) MCG/ACT inhaler Inhale 2 puffs into the lungs every 6 (six) hours as needed for wheezing or shortness of breath. 02/02/20   [provider]  amphetamine-dextroamphetamine (ADDERALL) 10 MG tablet Take 10 mg by mouth daily as needed (for work). 10mg  tablet with 20mg  capsule     [provider]  cyclobenzaprine (FLEXERIL) 10 MG tablet Take 10 mg by mouth 3 (three) times daily. 02/02/20   [provider]  naproxen sodium (ALEVE) 220 MG tablet Take 440 mg by mouth 2 (two) times daily as needed (pain/headache).    [provider]  ondansetron (ZOFRAN ODT) 8 MG disintegrating tablet 8mg  ODT q4 hours prn nausea 02/06/20   , MD  oxyCODONE-acetaminophen (PERCOCET) 5-325 MG tablet Take 1-2 tablets by mouth every 6 (six) hours as needed. 02/06/20   02/04/20, MD  predniSONE (STERAPRED UNI-PAK 21 TAB) 10 MG (21) TBPK tablet Take 10 mg by mouth as directed. 6 day taper pack 02/02/20   [provider]  tamsulosin (FLOMAX) 0.4 MG CAPS capsule Take 1 capsule (0.4 mg total) by mouth daily. 02/06/20   Geoffery Lyons, MD    Allergies    Amoxicillin and Penicillins  Review of Systems   Review of Systems  All other systems reviewed and are negative.   Physical Exam Updated Vital Signs BP (!) 125/104 (BP Location: Left Arm)   Pulse 71   Temp 97.7 F (36.5 C) (Oral)   Resp 16   Ht 1.702 m (5\' 7" )   Wt 56.7 kg   SpO2 100%   BMI 19.58 kg/m   Physical Exam Vitals and  nursing note reviewed.  Constitutional:      Appearance: He is well-developed.  HENT:     Head: Normocephalic and atraumatic.     Right Ear: External ear normal.     Left Ear: External ear normal.     Nose: Nose normal.  Eyes:     Conjunctiva/sclera: Conjunctivae normal.     Pupils: Pupils are equal, round, and reactive to light.  Cardiovascular:     Rate and Rhythm: Normal rate and regular rhythm.     Heart sounds: Normal heart sounds.  Pulmonary:     Effort: Pulmonary effort is normal.     Breath sounds: Normal breath sounds.  Abdominal:     General: Bowel sounds are normal.     Palpations: Abdomen is soft.  Musculoskeletal:        General: Normal range of motion.     Cervical back: Normal range of motion and neck supple.  Skin:    General: Skin is  warm and dry.  Neurological:     Mental Status: He is alert and oriented to person, place, and time.     Deep Tendon Reflexes: Reflexes are normal and symmetric.  Psychiatric:        Behavior: Behavior normal.        Thought Content: Thought content normal.        Judgment: Judgment normal.     ED Results / Procedures / Treatments   Labs (all labs ordered are listed, but only abnormal results are displayed) Labs Reviewed  URINALYSIS, ROUTINE W REFLEX MICROSCOPIC - Abnormal; Notable for the following components:      Result Value   Hgb urine dipstick MODERATE (*)    Leukocytes,Ua SMALL (*)    All other components within normal limits    EKG None  Radiology No results found.  Procedures Procedures (including critical care time)  Medications Ordered in ED Medications - No data to display  ED Course  I have reviewed the triage vital signs and the nursing notes.  Pertinent labs & imaging results that were available during my care of the patient were reviewed by me and considered in my medical decision making (see chart for details).    MDM Rules/Calculators/A&P                         Reviewed prior CT study.  Patient with 3 mm proximal left ureteral stone.  Plan a urinalysis, pain control, and patient has established outpatient urology follow-up  Final Clinical Impression(s) / ED Diagnoses Final diagnoses:  Left flank pain    Rx / DC Orders ED Discharge Orders    None       Margarita Grizzle, MD 02/26/20 1208

## 2020-02-27 ENCOUNTER — Other Ambulatory Visit: Payer: Self-pay | Admitting: Urology

## 2020-02-28 ENCOUNTER — Encounter (HOSPITAL_BASED_OUTPATIENT_CLINIC_OR_DEPARTMENT_OTHER): Payer: Self-pay | Admitting: Urology

## 2020-02-28 ENCOUNTER — Other Ambulatory Visit: Payer: Self-pay

## 2020-02-28 NOTE — Progress Notes (Signed)
Spoke w/ via phone for pre-op interview---pt Lab needs dos----none               Lab results------none COVID test ------03-01-20 1205 Arrive at -------1130 03-05-20 NPO after MN NO Solid Food.  Clear liquids from MN until---1030 am then npo Medications to take morning of surgery -----albuterol inhaler pnr/bring inhaler, ketorolac Diabetic medication -----n/a Patient Special Instructions -----none Pre-Op special Istructions -----none Patient verbalized understanding of instructions that were given at this phone interview. Patient denies shortness of breath, chest pain, fever, cough at this phone interview.

## 2020-03-01 ENCOUNTER — Other Ambulatory Visit (HOSPITAL_COMMUNITY): Payer: BLUE CROSS/BLUE SHIELD

## 2020-03-01 DIAGNOSIS — N201 Calculus of ureter: Secondary | ICD-10-CM | POA: Diagnosis not present

## 2020-03-02 NOTE — Addendum Note (Signed)
Addended by: Jannifer Hick on: 03/02/2020 03:29 PM   Modules accepted: Orders

## 2020-03-05 ENCOUNTER — Ambulatory Visit (HOSPITAL_BASED_OUTPATIENT_CLINIC_OR_DEPARTMENT_OTHER): Admission: RE | Admit: 2020-03-05 | Payer: BLUE CROSS/BLUE SHIELD | Source: Home / Self Care | Admitting: Urology

## 2020-03-05 HISTORY — DX: Personal history of urinary calculi: Z87.442

## 2020-03-05 HISTORY — DX: Unspecified asthma, uncomplicated: J45.909

## 2020-03-05 HISTORY — DX: Cardiac murmur, unspecified: R01.1

## 2020-03-05 HISTORY — DX: Migraine, unspecified, not intractable, without status migrainosus: G43.909

## 2020-03-05 SURGERY — CYSTOURETEROSCOPY, WITH STENT INSERTION
Anesthesia: General | Laterality: Left

## 2020-05-20 ENCOUNTER — Emergency Department (HOSPITAL_BASED_OUTPATIENT_CLINIC_OR_DEPARTMENT_OTHER): Payer: BLUE CROSS/BLUE SHIELD

## 2020-05-20 ENCOUNTER — Encounter (HOSPITAL_BASED_OUTPATIENT_CLINIC_OR_DEPARTMENT_OTHER): Payer: Self-pay | Admitting: Emergency Medicine

## 2020-05-20 ENCOUNTER — Emergency Department (HOSPITAL_BASED_OUTPATIENT_CLINIC_OR_DEPARTMENT_OTHER)
Admission: EM | Admit: 2020-05-20 | Discharge: 2020-05-20 | Disposition: A | Discharge status: Home / Self Care | Payer: BLUE CROSS/BLUE SHIELD | Attending: Emergency Medicine | Admitting: Emergency Medicine

## 2020-05-20 DIAGNOSIS — J45909 Unspecified asthma, uncomplicated: Secondary | ICD-10-CM | POA: Diagnosis not present

## 2020-05-20 DIAGNOSIS — W01198A Fall on same level from slipping, tripping and stumbling with subsequent striking against other object, initial encounter: Secondary | ICD-10-CM | POA: Insufficient documentation

## 2020-05-20 DIAGNOSIS — S59902A Unspecified injury of left elbow, initial encounter: Secondary | ICD-10-CM | POA: Diagnosis not present

## 2020-05-20 DIAGNOSIS — Z87891 Personal history of nicotine dependence: Secondary | ICD-10-CM | POA: Insufficient documentation

## 2020-05-20 DIAGNOSIS — W19XXXA Unspecified fall, initial encounter: Secondary | ICD-10-CM

## 2020-05-20 DIAGNOSIS — S51012A Laceration without foreign body of left elbow, initial encounter: Secondary | ICD-10-CM | POA: Insufficient documentation

## 2020-05-20 DIAGNOSIS — Y9301 Activity, walking, marching and hiking: Secondary | ICD-10-CM | POA: Insufficient documentation

## 2020-05-20 DIAGNOSIS — S5002XA Contusion of left elbow, initial encounter: Secondary | ICD-10-CM

## 2020-05-20 MED ORDER — LIDOCAINE-EPINEPHRINE (PF) 2 %-1:200000 IJ SOLN
10.0000 mL | Freq: Once | INTRAMUSCULAR | Status: AC
  Administered 2020-05-20: 10 mL
  Filled 2020-05-20: qty 20

## 2020-05-20 NOTE — ED Provider Notes (Signed)
[facility] EMERGENCY DEPARTMENT Provider Note   CSN: [id number] Arrival date & time: 05/20/20  1132     History Chief Complaint  Patient presents with  . Elbow Injury    Joe Cisneros is a 34 y.o. male.  He is complaining of injury to his left elbow after he slipped and fell last evening and landed on the cement.  He said he also struck his head but did not lose consciousness and does not have any symptoms from that.  He tried to wash out the wound but it continues to ooze.  Pain with any range of motion.  The history is provided by the patient.  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   Entrapped after fall: no   Pain details:    Quality:  Aching   Radiates to:  Does not radiate   Severity:  Moderate   Onset quality:  Sudden   Timing:  Constant   Progression:  Unchanged Handedness:  Right-handed Dislocation: no   Foreign body present:  No foreign bodies Tetanus status:  Up to date Prior injury to area:  No Relieved by:  Being still Worsened by:  Movement Ineffective treatments:  None tried Associated symptoms: no back pain, no fever, no muscle weakness, no numbness and no tingling        Past Medical History:  Diagnosis Date  . ADD (attention deficit disorder)   . Asthma    mild   . Heart murmur    "mild" no cardiologist  . History of kidney stones   . Migraine     Patient Active Problem List   Diagnosis Date Noted  . ADD (attention deficit disorder) 04/29/2013  . Asthma 04/29/2013    Past Surgical History:  Procedure Laterality Date  . KIDNEY STONE SURGERY  2016 or 2018   cystoscopy  . LITHOTRIPSY  2016 or 2018       No family history on file.  Social History   Tobacco Use  . Smoking status: Former Smoker    Packs/day: 1.00    Years: 10.00    Pack  years: 10.00    Types: Cigarettes    Quit date: 06/09/2008    Years since quitting: 11.9  . Smokeless tobacco: Never Used  Vaping Use  . Vaping Use: Every day  . Substances: Nicotine  Substance Use Topics  . Alcohol use: Not Currently  . Drug use: Yes    Types: Marijuana    Comment: marijuana occ last used 02-27-2020    Home Medications Prior to Admission medications   Medication Sig Start Date End Date Taking? Authorizing Provider  ADDERALL XR 20 MG 24 hr capsule Take 30 mg by mouth daily as needed (for work). 20mg  capsule with 10mg  capsule 01/11/20   [provider]  albuterol (VENTOLIN HFA) 108 (90 Base) MCG/ACT inhaler Inhale 2 puffs into the lungs every 6 (six) hours as needed for wheezing or shortness of breath. 02/02/20   [provider]  amphetamine-dextroamphetamine (ADDERALL) 10 MG tablet Take 10 mg by mouth daily as needed (for work). 10mg  tablet with 20mg  capsule    [provider]  cyclobenzaprine (FLEXERIL) 10 MG tablet Take 10 mg by mouth 3 (three) times daily. 02/02/20   [provider]  ketorolac (TORADOL) 10 MG tablet Take 10 mg by mouth every 8 (eight) hours as needed for pain. 02/10/20   [provider]  ondansetron (ZOFRAN) 4 MG tablet Take 4 mg by mouth every 6 (six) hours as needed for nausea/vomiting. 02/10/20   [provider]  oxyCODONE-acetaminophen (PERCOCET) 5-325 MG tablet Take 1-2 tablets by mouth every 6 (six) hours as needed. 02/06/20   Geoffery Lyons, MD  tamsulosin (FLOMAX) 0.4 MG CAPS capsule Take 1 capsule (0.4 mg total) by mouth daily. Patient taking differently: Take 0.4 mg by mouth daily after supper.  02/06/20   Geoffery Lyons, MD    Allergies    Amoxicillin and Penicillins  Review of Systems   Review of Systems  Constitutional: Negative for fever.  Musculoskeletal: Negative for back pain.  Skin: Positive for wound.  Neurological: Negative for weakness and numbness.    Physical Exam Updated Vital  Signs BP (!) 130/96 (BP Location: Right Arm)   Pulse 80   Temp 98.4 F (36.9 C) (Oral)   Resp 18   Ht 5\' 10"  (1.778 m)   Wt 59 kg   SpO2 100%   BMI 18.65 kg/m   Physical Exam Vitals and nursing note reviewed.  Constitutional:      Appearance: Normal appearance. He is well-developed and well-nourished.  HENT:     Head: Normocephalic and atraumatic.  Eyes:     Conjunctiva/sclera: Conjunctivae normal.  Pulmonary:     Effort: Pulmonary effort is normal.  Musculoskeletal:        General: Swelling, tenderness and signs of injury present. No deformity.     Cervical back: Neck supple.     Comments: LeftLeft shoulder wrist hand nontender.  Elbow tender olecranon.  He has an abrasion overlying along with a 3 cm jagged laceration.  There is no active bleeding.  He has pain with range of motion.  No visualized foreign bodies.  Skin:    General: Skin is warm and dry.     Capillary Refill: Capillary refill takes less than 2 seconds.  Neurological:     General: No focal deficit present.     Mental Status: He is alert.     GCS: GCS eye subscore is 4. GCS verbal subscore is 5. GCS motor subscore is 6.  Psychiatric:        Mood and Affect: Mood and affect normal.     ED Results / Procedures / Treatments   Labs (all labs ordered are listed, but only abnormal results are displayed) Labs Reviewed - No data to display  EKG None  Radiology DG Elbow Complete Left  Result Date: 05/20/2020 CLINICAL DATA:  Fall, pain EXAM: LEFT ELBOW - COMPLETE 3+ VIEW COMPARISON:  None. FINDINGS: There is no evidence of fracture, dislocation, or joint effusion. There is no evidence of arthropathy or other significant focal bone abnormality. Incidental small densely ossified bone island of the left radial head. Soft tissue edema over the olecranon. IMPRESSION: No fracture or dislocation of the left elbow. No elbow joint effusion. Soft tissue edema over the olecranon. Electronically Signed   By: 14/05/2020  M.D.   On: 05/20/2020 12:35    Procedures .14/12/2021Laceration Repair  Date/Time: 05/20/2020 12:41 PM Performed by: 14/05/2020, MD Authorized by: Terrilee Files, MD   Consent:    Consent obtained:  Verbal   Consent given by:  Patient   Risks discussed:  Infection, pain, poor cosmetic result, poor wound healing and retained foreign body  Alternatives discussed:  No treatment and delayed treatment Anesthesia:    Anesthesia method:  Local infiltration   Local anesthetic:  Lidocaine 2% WITH epi Laceration details:    Location:  Shoulder/arm   Shoulder/arm location:  L elbow   Length (cm):  3 Pre-procedure details:    Preparation:  Patient was prepped and draped in usual sterile fashion Exploration:    Wound exploration: wound explored through full range of motion and entire depth of wound visualized     Contaminated: no   Treatment:    Area cleansed with:  Saline   Amount of cleaning:  Standard   Irrigation solution:  Sterile saline   Debridement:  None   Undermining:  None   Scar revision: no   Skin repair:    Repair method:  Sutures   Suture size:  4-0   Suture material:  Prolene   Suture technique:  Simple interrupted   Number of sutures:  3 Approximation:    Approximation:  Close Post-procedure details:    Dressing:  Sterile dressing   Procedure completion:  Tolerated well, no immediate complications   (including critical care time)  Medications Ordered in ED Medications  lidocaine-EPINEPHrine (XYLOCAINE W/EPI) 2 %-1:200000 (PF) injection 10 mL (has no administration in time range)    ED Course  I have reviewed the triage vital signs and the nursing notes.  Pertinent labs & imaging results that were available during my care of the patient were reviewed by me and considered in my medical decision making (see chart for details).  Clinical Course as of 05/20/20 1241  Sun May 20, 2020  1232 Left elbow x-ray interpreted by me as no fracture or dislocation.  [MB]    Clinical Course User Index [MB] Terrilee Files, MD   MDM Rules/Calculators/A&P                         Differential diagnosis includes fracture, contusion, dislocation, retained foreign body, laceration  Final Clinical Impression(s) / ED Diagnoses Final diagnoses:  Fall, initial encounter  Contusion of left elbow, initial encounter  Elbow laceration, left, initial encounter    Rx / DC Orders ED Discharge Orders    None       Terrilee Files, MD 05/21/20 1415

## 2020-05-20 NOTE — ED Triage Notes (Signed)
Pt slipped on the wet ground last night and landed on cement on his L elbow. Pt has a oozing wound noted and pain.

## 2020-05-20 NOTE — Discharge Instructions (Addendum)
You are seen in return for evaluation of injury to your left elbow after a fall.  Your x-ray did not show any fracture or dislocation.  Your wound was irrigated and repaired with sutures.  This will need to be removed in 10 days.  Soap and water.  Ibuprofen for pain.  Return if any signs of infection or other concerns.

## 2020-09-09 IMAGING — CT CT RENAL STONE PROTOCOL
2 of 4 series · 16 of 46 positions shown, 18 images · non-contrast
Comparison: 3349

CLINICAL DATA: Left flank pain, history of kidney stones

EXAM:
CT ABDOMEN AND PELVIS WITHOUT CONTRAST
TECHNIQUE: Multidetector CT imaging of the abdomen and pelvis was performed
following the standard protocol without IV contrast.

[Series 2: axial st · axial · 0.64mm/px · z∈[-498,-74]mm · 13 of 95 slices shown, 15 images]
[im 5/95  soft-tissue]
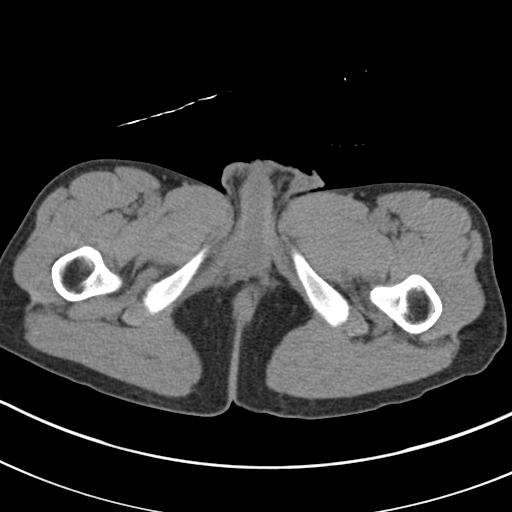
[im 5/95  bone]
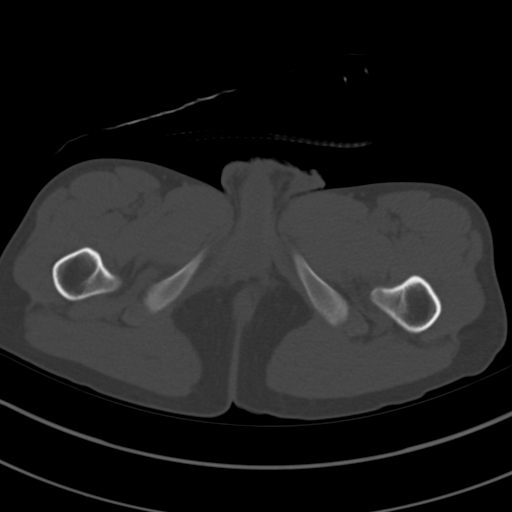
[im 15/95  soft-tissue]
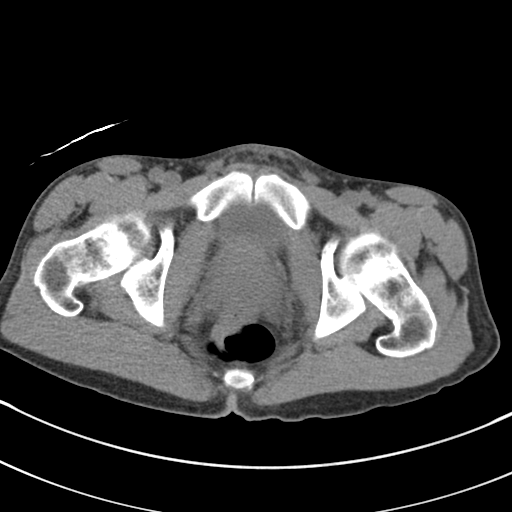
[im 19/95  soft-tissue]
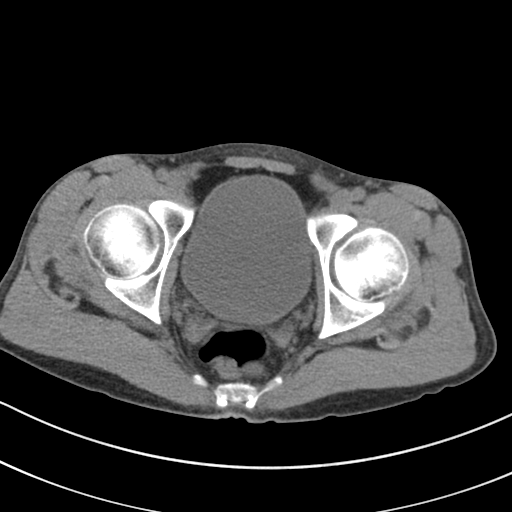
[im 29/95  soft-tissue]
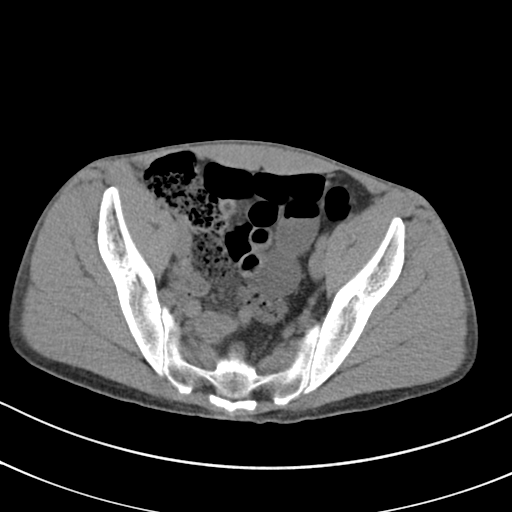
[im 33/95  soft-tissue]
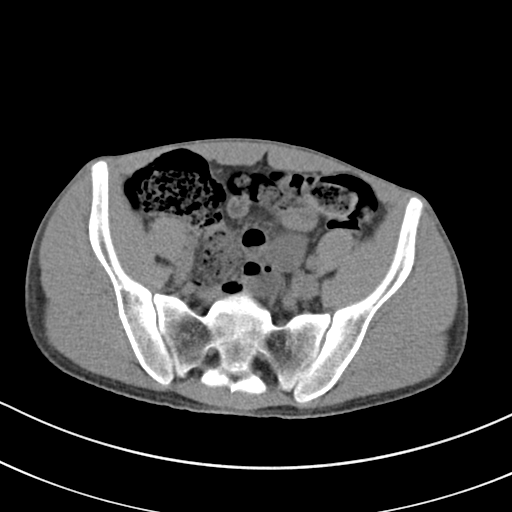
[im 43/95  soft-tissue]
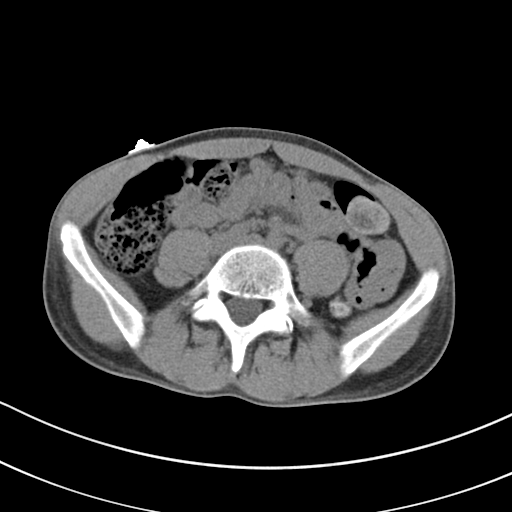
[im 48/95  soft-tissue]
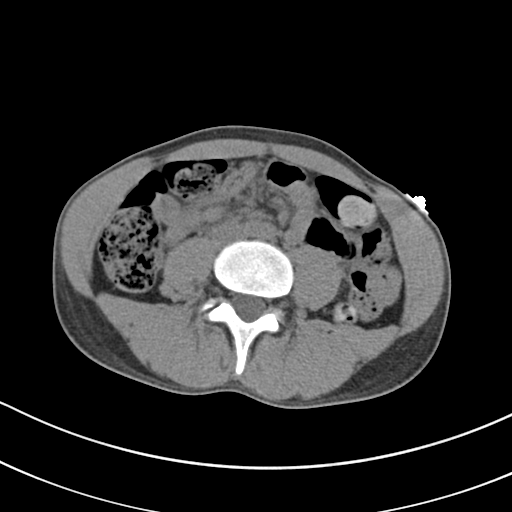
[im 52/95  soft-tissue]
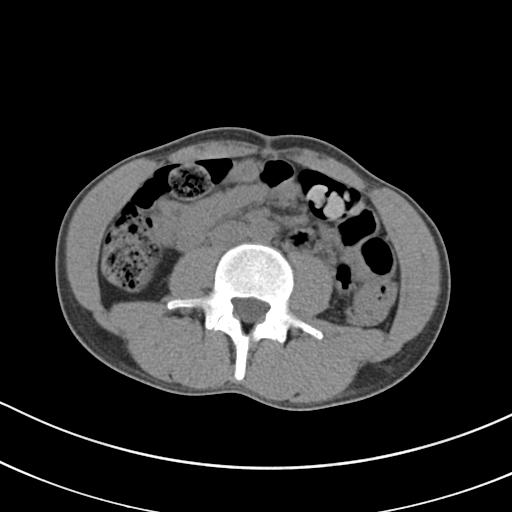
[im 62/95  soft-tissue]
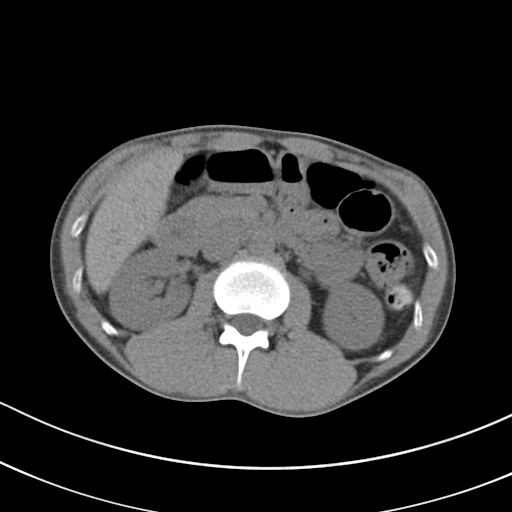
[im 62/95  bone]
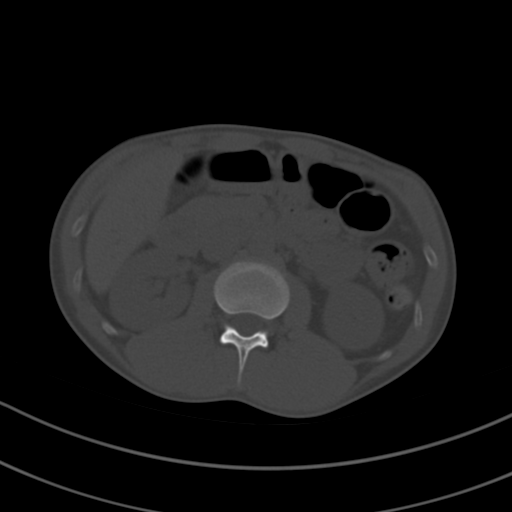
[im 66/95  soft-tissue]
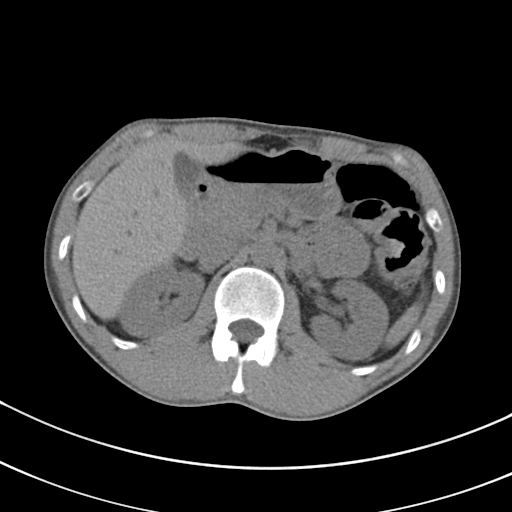
[im 76/95  soft-tissue]
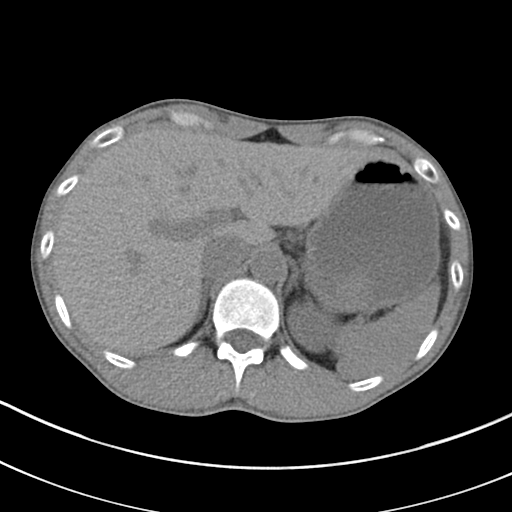
[im 80/95  soft-tissue]
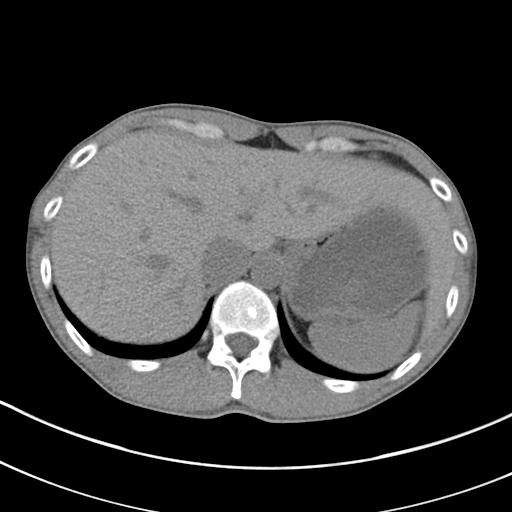
[im 90/95  soft-tissue]
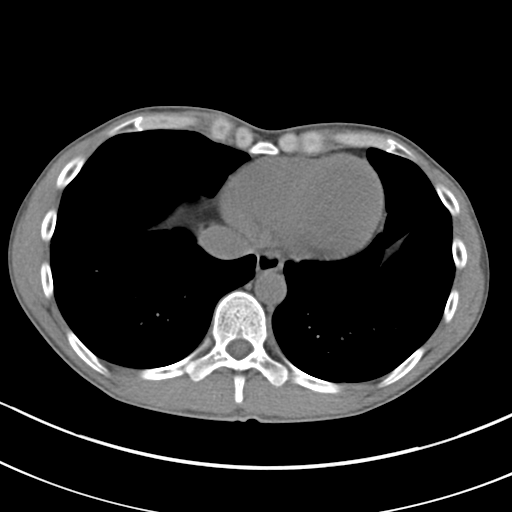

[Series 4: coronal · coronal · 0.64mm/px · 3 of 103 slices shown]
[im 35/103  soft-tissue]
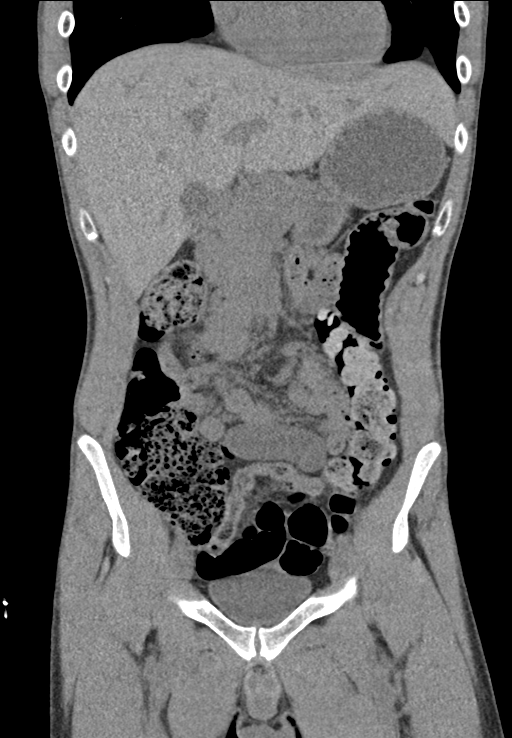
[im 46/103  soft-tissue]
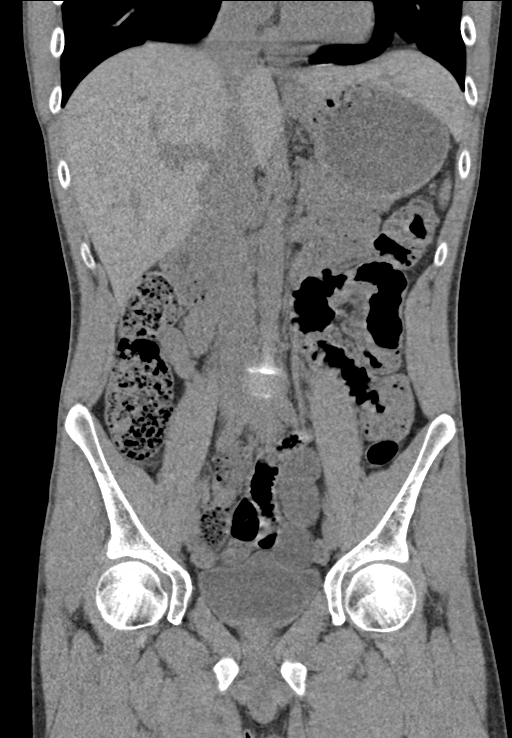
[im 57/103  soft-tissue]
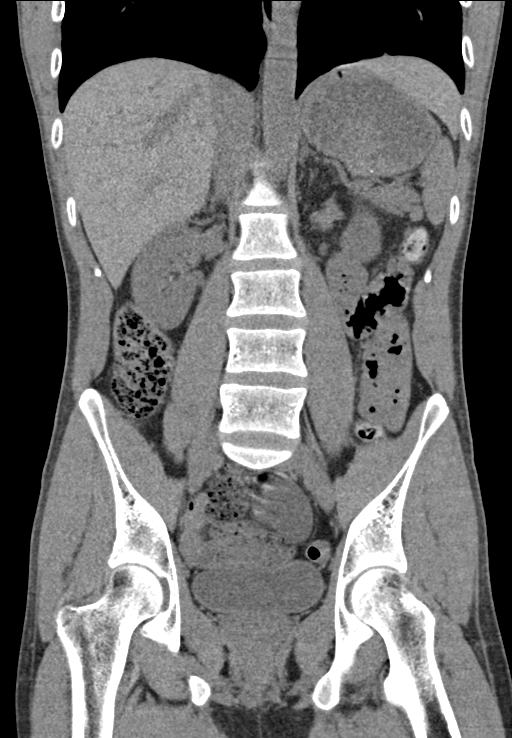

[16 of 46 positions shown; findings below may reference images not displayed]

FINDINGS: Lower chest: No acute abnormality.

Hepatobiliary: No focal liver abnormality is seen. No gallstones,
gallbladder wall thickening, or biliary dilatation.

Pancreas: Unremarkable.

Spleen: Unremarkable.

Adrenals/Urinary Tract: Adrenals are unremarkable. Few punctate
nonobstructing bilateral renal calculi. There is a 3 mm calculus of
the proximal left ureter. Mild dilatation of the proximal left
ureter and mild left pelviectasis. Bladder is unremarkable.

Stomach/Bowel: Stomach is within normal limits. Bowel is normal in
caliber.

Vascular/Lymphatic: No significant vascular findings on this
noncontrast study. No enlarged lymph nodes identified.

Reproductive: Unremarkable.

Other: No ascites.  Abdominal wall is unremarkable.

Musculoskeletal: No significant or acute osseous abnormality.
IMPRESSION: 3 mm calculus of the proximal left ureter without significant
hydronephrosis.

Few punctate bilateral renal calculi.

## 2022-04-18 ENCOUNTER — Emergency Department (HOSPITAL_BASED_OUTPATIENT_CLINIC_OR_DEPARTMENT_OTHER): Payer: 59

## 2022-04-18 ENCOUNTER — Other Ambulatory Visit (HOSPITAL_BASED_OUTPATIENT_CLINIC_OR_DEPARTMENT_OTHER): Payer: Self-pay

## 2022-04-18 ENCOUNTER — Emergency Department (HOSPITAL_BASED_OUTPATIENT_CLINIC_OR_DEPARTMENT_OTHER)
Admission: EM | Admit: 2022-04-18 | Discharge: 2022-04-18 | Disposition: A | Discharge status: Home / Self Care | Payer: 59 | Attending: Emergency Medicine | Admitting: Emergency Medicine

## 2022-04-18 ENCOUNTER — Encounter (HOSPITAL_BASED_OUTPATIENT_CLINIC_OR_DEPARTMENT_OTHER): Payer: Self-pay | Admitting: Emergency Medicine

## 2022-04-18 ENCOUNTER — Other Ambulatory Visit: Payer: Self-pay

## 2022-04-18 DIAGNOSIS — R9431 Abnormal electrocardiogram [ECG] [EKG]: Secondary | ICD-10-CM | POA: Insufficient documentation

## 2022-04-18 DIAGNOSIS — R0789 Other chest pain: Secondary | ICD-10-CM

## 2022-04-18 DIAGNOSIS — Z20822 Contact with and (suspected) exposure to covid-19: Secondary | ICD-10-CM | POA: Insufficient documentation

## 2022-04-18 DIAGNOSIS — R112 Nausea with vomiting, unspecified: Secondary | ICD-10-CM | POA: Diagnosis present

## 2022-04-18 DIAGNOSIS — K529 Noninfective gastroenteritis and colitis, unspecified: Secondary | ICD-10-CM | POA: Diagnosis not present

## 2022-04-18 LAB — TROPONIN I (HIGH SENSITIVITY)
Troponin I (High Sensitivity): 2 ng/L (ref <18)
Troponin I (High Sensitivity): 3 ng/L (ref <18)

## 2022-04-18 LAB — COMPREHENSIVE METABOLIC PANEL
ALT: 18 U/L (ref 0–44)
AST: 21 U/L (ref 15–41)
Albumin: 4.2 g/dL (ref 3.5–5.0)
Alkaline Phosphatase: 58 U/L (ref 38–126)
Anion gap: 6 (ref 5–15)
BUN: 12 mg/dL (ref 6–20)
CO2: 27 mmol/L (ref 22–32)
Calcium: 8.7 mg/dL — ABNORMAL LOW (ref 8.9–10.3)
Chloride: 106 mmol/L (ref 98–111)
Creatinine, Ser: 0.85 mg/dL (ref 0.61–1.24)
GFR, Estimated: >60 mL/min (ref >60)
Glucose, Bld: 91 mg/dL (ref 70–99)
Potassium: 3.8 mmol/L (ref 3.5–5.1)
Sodium: 139 mmol/L (ref 135–145)
Total Bilirubin: 0.3 mg/dL (ref 0.3–1.2)
Total Protein: 7 g/dL (ref 6.5–8.1)

## 2022-04-18 LAB — CBC WITH DIFFERENTIAL/PLATELET
Abs Immature Granulocytes: 0.01 K/uL (ref 0.00–0.07)
Basophils Absolute: 0.1 K/uL (ref 0.0–0.1)
Basophils Relative: 2 %
Eosinophils Absolute: 0.5 K/uL (ref 0.0–0.5)
Eosinophils Relative: 8 %
HCT: 40.7 % (ref 39.0–52.0)
Hemoglobin: 13.5 g/dL (ref 13.0–17.0)
Immature Granulocytes: 0 %
Lymphocytes Relative: 33 %
Lymphs Abs: 2.1 K/uL (ref 0.7–4.0)
MCH: 29.2 pg (ref 26.0–34.0)
MCHC: 33.2 g/dL (ref 30.0–36.0)
MCV: 88.1 fL (ref 80.0–100.0)
Monocytes Absolute: 0.4 K/uL (ref 0.1–1.0)
Monocytes Relative: 6 %
Neutro Abs: 3.3 K/uL (ref 1.7–7.7)
Neutrophils Relative %: 51 %
Platelets: 291 K/uL (ref 150–400)
RBC: 4.62 MIL/uL (ref 4.22–5.81)
RDW: 13 % (ref 11.5–15.5)
WBC: 6.4 K/uL (ref 4.0–10.5)
nRBC: 0 % (ref 0.0–0.2)

## 2022-04-18 LAB — SARS CORONAVIRUS 2 BY RT PCR: SARS Coronavirus 2 by RT PCR: NEGATIVE

## 2022-04-18 LAB — URINALYSIS, ROUTINE W REFLEX MICROSCOPIC
Bilirubin Urine: NEGATIVE
Glucose, UA: NEGATIVE mg/dL
Hgb urine dipstick: NEGATIVE
Ketones, ur: NEGATIVE mg/dL
Leukocytes,Ua: NEGATIVE
Nitrite: NEGATIVE
Protein, ur: NEGATIVE mg/dL
Specific Gravity, Urine: 1.015 (ref 1.005–1.030)
pH: 8.5 — ABNORMAL HIGH (ref 5.0–8.0)

## 2022-04-18 LAB — D-DIMER, QUANTITATIVE: D-Dimer, Quant: 0.28 ug/mL-FEU (ref 0.00–0.50)

## 2022-04-18 LAB — LIPASE, BLOOD: Lipase: 85 U/L — ABNORMAL HIGH (ref 11–51)

## 2022-04-18 LAB — C-REACTIVE PROTEIN: CRP: <0.5 mg/dL (ref <1.0)

## 2022-04-18 LAB — SEDIMENTATION RATE: Sed Rate: 1 mm/hr (ref 0–16)

## 2022-04-18 LAB — T4, FREE: Free T4: 1 ng/dL (ref 0.61–1.12)

## 2022-04-18 LAB — TSH: TSH: 0.989 u[IU]/mL (ref 0.350–4.500)

## 2022-04-18 MED ORDER — IOHEXOL 300 MG/ML  SOLN
80.0000 mL | Freq: Once | INTRAMUSCULAR | Status: AC | PRN
  Administered 2022-04-18: 80 mL via INTRAVENOUS

## 2022-04-18 MED ORDER — SODIUM CHLORIDE 0.9 % IV BOLUS
1000.0000 mL | Freq: Once | INTRAVENOUS | Status: AC
  Administered 2022-04-18: 1000 mL via INTRAVENOUS

## 2022-04-18 MED ORDER — ONDANSETRON HCL 4 MG/2ML IJ SOLN
4.0000 mg | Freq: Once | INTRAMUSCULAR | Status: AC
  Administered 2022-04-18: 4 mg via INTRAVENOUS
  Filled 2022-04-18: qty 2

## 2022-04-18 MED ORDER — NAPROXEN 500 MG PO TABS
500.0000 mg | ORAL_TABLET | Freq: Two times a day (BID) | ORAL | 0 refills | Status: AC | PRN
  Filled 2022-04-18: qty 30, 15d supply, fill #0

## 2022-04-18 MED ORDER — ONDANSETRON 4 MG PO TBDP
4.0000 mg | ORAL_TABLET | Freq: Three times a day (TID) | ORAL | 0 refills | Status: DC | PRN
  Filled 2022-04-18: qty 20, 7d supply, fill #0

## 2022-04-18 NOTE — ED Notes (Signed)
Pt states nausea improved, provided liquids for po challenge

## 2022-04-18 NOTE — ED Notes (Signed)
ED Provider at bedside with US 

## 2022-04-18 NOTE — ED Provider Notes (Signed)
[facility] Provider Note   CSN: [id number] Arrival date & time: 04/18/22  8466     History  Chief Complaint  Patient presents with   Chest Pain    Joe Cisneros is a 36 y.o. male.  Vomiting and diarrhea for the past  Patient reports nausea, vomiting, diarrhea for the past 1 week.  Reports about 3 episodes of vomiting per day as well as 3-4 episodes of nonbloody diarrhea per day for day.  Associated periumbilical abdominal cramping.  No pain with urination or blood in the urine.  Chills but no fever.  No travel or sick contacts.  No recent antibiotic use.  Still has appendix and gallbladder.  Also with intermittent left-sided chest pain for the past 3 days coming and going.  Sharp stabbing pain to left chest lasting for 5 to 10 seconds at a time.  Pain does not radiate.  Some associated shortness of breath.  No cough or fever.  Denies any personal cardiac history.  Denies any known CAD.  Reports people in his family have CAD but no one had a heart attack before age 59.  Pain is not exertional or pleuritic    The history is provided by the patient.  Chest Pain Associated symptoms: abdominal pain, fever, nausea and vomiting   Associated symptoms: no cough, no dizziness, no headache, no shortness of breath and no weakness        Home Medications Prior to Admission medications   Medication Sig Start Date End Date Taking? Authorizing Provider  ADDERALL XR 20 MG 24 hr capsule Take 30 mg by mouth daily as needed (for work). 14m capsule with 170mcapsule 01/11/20   [provider]  albuterol (VENTOLIN HFA) 108 (90 Base) MCG/ACT inhaler Inhale 2 puffs into the lungs every 6 (six) hours as needed for wheezing or shortness of breath. 02/02/20   [provider]  amphetamine-dextroamphetamine (ADDERALL) 10 MG tablet Take 10 mg by mouth daily as needed (for work). 1023mablet with 3m41mpsule    [provider]  cyclobenzaprine  (FLEXERIL) 10 MG tablet Take 10 mg by mouth 3 (three) times daily. 02/02/20   [provider]  ketorolac (TORADOL) 10 MG tablet Take 10 mg by mouth every 8 (eight) hours as needed for pain. 02/10/20   [provider]  ondansetron (ZOFRAN) 4 MG tablet Take 4 mg by mouth every 6 (six) hours as needed for nausea/vomiting. 02/10/20   [provider]  oxyCODONE-acetaminophen (PERCOCET) 5-325 MG tablet Take 1-2 tablets by mouth every 6 (six) hours as needed. 02/06/20   DeloVeryl Speak  tamsulosin (FLOMAX) 0.4 MG CAPS capsule Take 1 capsule (0.4 mg total) by mouth daily. Patient taking differently: Take 0.4 mg by mouth daily after supper.  02/06/20   DeloVeryl Speak      Allergies    Amoxicillin and Penicillins    Review of Systems   Review of Systems  Constitutional:  Positive for activity change, appetite change, chills and fever.  HENT:  Negative for congestion and rhinorrhea.   Respiratory:  Negative for cough, chest tightness and shortness of breath.   Cardiovascular:  Positive for chest pain.  Gastrointestinal:  Positive for abdominal pain, diarrhea, nausea and vomiting.  Genitourinary:  Negative for dysuria and hematuria.  Musculoskeletal:  Negative for arthralgias and myalgias.  Skin:  Negative for rash.  Neurological:  Negative for dizziness, weakness and headaches.   all other systems are negative except as noted  in the HPI and PMH.    Physical Exam Updated Vital Signs BP (!) 154/94   Pulse 67   Temp 97.9 F (36.6 C) (Oral)   Resp 17   Ht 5' 10" (1.778 m)   Wt 55.5 kg   SpO2 100%   BMI 17.55 kg/m  Physical Exam Vitals and nursing note reviewed.  Constitutional:      General: He is not in acute distress.    Appearance: He is well-developed. He is not ill-appearing.  HENT:     Head: Normocephalic and atraumatic.     Mouth/Throat:     Pharynx: No oropharyngeal exudate.  Eyes:     Conjunctiva/sclera: Conjunctivae normal.     Pupils: Pupils are  equal, round, and reactive to light.  Neck:     Comments: No meningismus. Cardiovascular:     Rate and Rhythm: Normal rate and regular rhythm.     Heart sounds: Normal heart sounds. No murmur heard. Pulmonary:     Effort: Pulmonary effort is normal. No respiratory distress.     Breath sounds: Normal breath sounds.  Abdominal:     Palpations: Abdomen is soft.     Tenderness: There is abdominal tenderness. There is no guarding or rebound.     Comments: Periumbilical tenderness, no guarding or rebound  Musculoskeletal:        General: No tenderness. Normal range of motion.     Cervical back: Normal range of motion and neck supple.  Skin:    General: Skin is warm.     Capillary Refill: Capillary refill takes less than 2 seconds.     Findings: No rash.  Neurological:     General: No focal deficit present.     Mental Status: He is alert and oriented to person, place, and time. Mental status is at baseline.     Cranial Nerves: No cranial nerve deficit.     Motor: No abnormal muscle tone.     Coordination: Coordination normal.     Comments:  5/5 strength throughout. CN 2-12 intact.Equal grip strength.   Psychiatric:        Behavior: Behavior normal.     ED Results / Procedures / Treatments   Labs (all labs ordered are listed, but only abnormal results are displayed) Labs Reviewed  COMPREHENSIVE METABOLIC PANEL - Abnormal; Notable for the following components:      Result Value   Calcium 8.7 (*)    All other components within normal limits  LIPASE, BLOOD - Abnormal; Notable for the following components:   Lipase 85 (*)    All other components within normal limits  URINALYSIS, ROUTINE W REFLEX MICROSCOPIC - Abnormal; Notable for the following components:   pH 8.5 (*)    All other components within normal limits  C DIFFICILE QUICK SCREEN W PCR REFLEX    GASTROINTESTINAL PANEL BY PCR, STOOL (REPLACES STOOL CULTURE)  SARS CORONAVIRUS 2 BY RT PCR  CBC WITH DIFFERENTIAL/PLATELET   D-DIMER, QUANTITATIVE  SEDIMENTATION RATE  C-REACTIVE PROTEIN  TSH  T4, FREE  TROPONIN I (HIGH SENSITIVITY)  TROPONIN I (HIGH SENSITIVITY)    EKG EKG Interpretation  Date/Time:  Friday April 18 2022 07:55:07 EST Ventricular Rate:  68 PR Interval:  153 QRS Duration: 87 QT Interval:  382 QTC Calculation: 407 R Axis:   81 Text Interpretation: Sinus rhythm RSR' in V1 or V2, right VCD or RVH ST elevation suggests acute pericarditis diffuse ST elevation similar to previous Confirmed by Rancour, Stephen ([id number]) on 04/18/2022   7:58:39 AM  Radiology CT ABDOMEN PELVIS W CONTRAST  Result Date: 04/18/2022 CLINICAL DATA:  Abdominal pain.  Nausea vomiting and diarrhea. EXAM: CT ABDOMEN AND PELVIS WITH CONTRAST TECHNIQUE: Multidetector CT imaging of the abdomen and pelvis was performed using the standard protocol following bolus administration of intravenous contrast. RADIATION DOSE REDUCTION: This exam was performed according to the departmental dose-optimization program which includes automated exposure control, adjustment of the mA and/or kV according to patient size and/or use of iterative reconstruction technique. CONTRAST:  13m OMNIPAQUE IOHEXOL 300 MG/ML  SOLN COMPARISON:  11/19/2021 FINDINGS: Lower chest: Unremarkable. Hepatobiliary: No suspicious focal abnormality within the liver parenchyma. There is no evidence for gallstones, gallbladder wall thickening, or pericholecystic fluid. No intrahepatic or extrahepatic biliary dilation. Pancreas: No focal mass lesion. No dilatation of the main duct. No intraparenchymal cyst. No peripancreatic edema. Spleen: No splenomegaly. No focal mass lesion. Adrenals/Urinary Tract: No adrenal nodule or mass. Punctate nonobstructing stone identified lower pole right kidney. Tiny stones seen in the lower pole left kidney on previous CT stone study is barely discernible on image 31/2 today. Tiny low-density lesions in the left kidney are too small to characterize  but most compatible with simple cyst. No followup imaging is recommended. No evidence for hydroureter. The urinary bladder appears normal for the degree of distention. Stomach/Bowel: Stomach is unremarkable. No gastric wall thickening. No evidence of outlet obstruction. Duodenum is normally positioned as is the ligament of Treitz. Jejunal loops are not well distended but do appear to demonstrate circumferential wall thickening without substantial perienteric edema or inflammation. Ileal loops unremarkable. The terminal ileum is normal. The appendix is not well visualized, but there is no edema or inflammation in the region of the cecum. No gross colonic mass. No colonic wall thickening. Vascular/Lymphatic: No abdominal aortic aneurysm. No abdominal aortic atherosclerotic calcification. There is no gastrohepatic or hepatoduodenal ligament lymphadenopathy. No retroperitoneal or mesenteric lymphadenopathy. No pelvic sidewall lymphadenopathy. Reproductive: The prostate gland and seminal vesicles are unremarkable. Other: No intraperitoneal free fluid. Musculoskeletal: No worrisome lytic or sclerotic osseous abnormality. IMPRESSION: 1. Jejunal loops are not well distended but do appear to demonstrate circumferential wall thickening without substantial perienteric edema or inflammation. Imaging features could be related to an infectious/inflammatory enteritis. No mesenteric edema or intraperitoneal free fluid. 2. Punctate nonobstructing bilateral renal stones. Electronically Signed   By: EMisty StanleyM.D.   On: 04/18/2022 09:30   DG Chest 2 View  Result Date: 04/18/2022 CLINICAL DATA:  Chest pain. EXAM: CHEST - 2 VIEW COMPARISON:  September 27, 2004. FINDINGS: The heart size and mediastinal contours are within normal limits. Stable mild biapical scarring is noted. Mild left basilar scarring is noted. No acute pulmonary disease is noted. The visualized skeletal structures are unremarkable. IMPRESSION: No active  cardiopulmonary disease. Electronically Signed   By: JMarijo ConceptionM.D.   On: 04/18/2022 08:52    Procedures Ultrasound ED Echo  Date/Time: 04/18/2022 8:59 AM  Performed by: REzequiel Essex MD Authorized by: REzequiel Essex MD   Procedure details:    Indications: chest pain     Views: subxiphoid, parasternal long axis view and apical 4 chamber view     Images: archived     Limitations:  Body habitus Findings:    Pericardium: no pericardial effusion     LV Function: normal (>50% EF)     RV Diameter: normal     IVC: normal   Impression:    Impression: normal       Medications Ordered  in ED Medications  sodium chloride 0.9 % bolus 1,000 mL (has no administration in time range)  ondansetron (ZOFRAN) injection 4 mg (has no administration in time range)    ED Course/ Medical Decision Making/ A&P                           Medical Decision Making Amount and/or Complexity of Data Reviewed Labs: ordered. Decision-making details documented in ED Course. Radiology: ordered and independent interpretation performed. Decision-making details documented in ED Course. ECG/medicine tests: ordered and independent interpretation performed. Decision-making details documented in ED Course.  Risk Prescription drug management.  1 week of intermittent vomiting, diarrhea and periumbilical abdominal pain.  No fever.  Intermittent left-sided chest pain for the past 3 days.  None currently.  EKG shows diffuse ST elevation similar to previous.  Considered pericarditis.  Abdomen soft but tender periumbilically.  No peritoneal signs  EKG concerning for acute pericarditis with diffuse ST elevation and J-point elevation however similar to previous.  Echo does not show any obvious pericardial effusion.  D-dimer is negative.  Low suspicion for pulmonary embolism  Patient given IV fluids and symptom control.  Labs are reassuring.  No leukocytosis.  LFTs are normal.  Lipase mildly elevated 85.   Troponin negative, D-dimer negative.  Low suspicion for ACS or pulmonary embolism.  Chest pain description is atypical for ACS.  Does have diffuse ST elevation on his EKG and some concern for pericarditis but no pericardial effusion. EKG d/w Dr. Archarya cardiology.  She agrees similar to 2006 with some concern for pericarditis or early repolarization.  Agrees with ESR and CRP. Agrees not consistent with STEMI.   CT abdomen pelvis shows normal appendix.  Does show some thickening of judged no loops without evidence of obstruction.  Concerning for infectious or inflammatory enteritis.  Troponin negative x2.  Low suspicion for ACS.  ESR is normal which is reassuring and perhaps rules against pericarditis. CRP pending.  Patient tolerating p.o., abdominal pain is improved.  Suspect likely viral gastroenteritis and atypical chest pain, possible pericarditis.  We will arrange follow up with PCP as well as cardiology given his abnormal EKG for echocardiogram.  Return precautions discussed including exertional chest pain, pain associate with shortness of breath, nausea, vomiting, diaphoresis or any other concerns        Final Clinical Impression(s) / ED Diagnoses Final diagnoses:  Gastroenteritis  Abnormal EKG  Atypical chest pain    Rx / DC Orders ED Discharge Orders     None         Rancour, Stephen, MD 04/18/22 1155  

## 2022-04-18 NOTE — ED Notes (Signed)
ED Provider at bedside. 

## 2022-04-18 NOTE — Discharge Instructions (Signed)
There is no evidence of heart attack or blood clot in the lung.  As we discussed your EKG is abnormal and you should follow-up with cardiology for an echocardiogram.  The cardiologist should call you for an appointment early next week.  Take the anti-inflammatory medication nausea medication as prescribed.  Keep yourself hydrated.  Start with a clear liquid diet and advance slowly as tolerated.  Return to the ED with exertional chest pain, pain associate with shortness of breath, nausea, vomiting, sweating, other concerns.

## 2022-04-18 NOTE — ED Triage Notes (Signed)
Pt arrives pov, steady gait, c/o abdominal pain with N/V/D x 1 week, LT side CP x 3 days. PT endorses chills

## 2022-04-23 ENCOUNTER — Ambulatory Visit: Payer: 59 | Attending: Cardiology | Admitting: Cardiology

## 2022-04-23 ENCOUNTER — Encounter: Payer: Self-pay | Admitting: Cardiology

## 2022-04-23 VITALS — BP 108/68 | HR 60 | Ht 70.0 in | Wt 126.0 lb

## 2022-04-23 DIAGNOSIS — R072 Precordial pain: Secondary | ICD-10-CM

## 2022-04-23 DIAGNOSIS — R0789 Other chest pain: Secondary | ICD-10-CM

## 2022-04-23 NOTE — Addendum Note (Signed)
Addended by: Frutoso Schatz on: 04/23/2022 01:56 PM   Modules accepted: Orders

## 2022-04-23 NOTE — Patient Instructions (Addendum)
Medication Instructions:  Your physician recommends that you continue on your current medications as directed. Please refer to the Current Medication list given to you today.  *If you need a refill on your cardiac medications before your next appointment, please call your pharmacy*  Testing/Procedures: Your physician has requested that you have an echocardiogram. Echocardiography is a painless test that uses sound waves to create images of your heart. It provides your doctor with information about the size and shape of your heart and how well your heart's chambers and valves are working. This procedure takes approximately one hour. There are no restrictions for this procedure. Please do NOT wear cologne, perfume, aftershave, or lotions (deodorant is allowed). Please arrive 15 minutes prior to your appointment time.  Your physician has requested that you have cardiac CT. Cardiac computed tomography (CT) is a painless test that uses an x-ray machine to take clear, detailed pictures of your heart. For further information please visit [email or web address] Please follow instruction sheet as given.  Follow-Up: At [facility], you and your health needs are our priority.  As part of our continuing mission to provide you with exceptional heart care, we have created designated Provider Care Teams.  These Care Teams include your primary Cardiologist (physician) and Advanced Practice Providers (APPs -  Physician Assistants and Nurse Practitioners) who all work together to provide you with the care you need, when you need it.  Your next appointment:   Follow up with Dr. Mayford Knife as needed.   Other Instructions   Your cardiac CT will be scheduled at one of the below locations:   [facility] [address] [place], [place] [address] [phone]  OR  [facility] [address] [place], [place] [address] [phone]  OR    [facility] [address] [place], [place] [address] [phone]  If scheduled at [facility], please arrive at the [facility] and Children's Entrance (Entrance C2) of [facility] 30 minutes prior to test start time. You can use the FREE valet parking offered at entrance C (encouraged to control the heart rate for the test)  Proceed to the [facility] Radiology Department (first floor) to check-in and test prep.  All radiology patients and guests should use entrance C2 at [facility], accessed from [facility], even though the hospital's physical address listed is [address]    If scheduled at [facility] or [facility], please arrive 15 mins early for check-in and test prep.   Please follow these instructions carefully (unless otherwise directed):  Hold all erectile dysfunction medications at least 3 days (72 hrs) prior to test. (Ie viagra, cialis, sildenafil, tadalafil, etc) We will administer nitroglycerin during this exam.   On the Night Before the Test: Be sure to Drink plenty of water. Do not consume any caffeinated/decaffeinated beverages or chocolate 12 hours prior to your test. Do not take any antihistamines 12 hours prior to your test.  On the Day of the Test: Drink plenty of water until 1 hour prior to the test. Do not eat any food 1 hour prior to test. You may take your regular medications prior to the test.   After the Test: Drink plenty of water. After receiving IV contrast, you may experience a mild flushed feeling. This is normal. On occasion, you may experience a mild rash up to 24 hours after the test. This is not  dangerous. If this occurs, you can take Benadryl 25 mg and increase your fluid intake. If you experience trouble breathing, this can be serious. If it is severe call 911 IMMEDIATELY. If it is mild, please call our office. If you take  any of these medications: Glipizide/Metformin, Avandament, Glucavance, please do not take 48 hours after completing test unless otherwise instructed.  We will call to schedule your test 2-4 weeks out understanding that some insurance companies will need an authorization prior to the service being performed.   For non-scheduling related questions, please contact the cardiac imaging nurse navigator should you have any questions/concerns: Joe Cisneros, Cardiac Imaging Nurse Navigator Joe Cisneros, Cardiac Imaging Nurse Navigator [facility] Direct Office Dial: [phone]   For scheduling needs, including cancellations and rescheduling, please call Joe Cisneros, [phone].   Important Information About Sugar

## 2022-04-23 NOTE — Progress Notes (Addendum)
CARDIOLOGY  CONSULT Note    Date:  04/23/2022   ID:  Worthy Flank, DOB 1985/09/08, MRN [id number]  PCP:  Soundra Pilon, FNP  Cardiologist:  Armanda Magic, MD   Chief Complaint  Patient presents with   New Patient (Initial Visit)    Atypical chest pain    History of Present Illness:  Joe Cisneros is a 36 y.o. male who is being seen today for the evaluation of chest pain at the request of Glynn Octave, MD.  This is a 36 year old male with a history of ADD and kidney stones who presented to the emergency room on 04/18/2022 with nausea, vomiting and diarrhea for 1 week.  He was having 3 episodes of vomiting daily with 3-4 episodes of nonbloody diarrhea per day.  He was also having abdominal discomfort.  He also complained of intermittent left-sided chest pain for 3 days off and on which was sharp and stabbing to the left chest lasting 5 to 10 seconds at a time with no radiation.  There was some associated shortness of breath.  He does have a family history of CAD but no premature CAD.  Work-up in the ER showed a normal CRP of less than 0.5 and troponins of 2 and 3.  EKG showed normal sinus with RSR prime in V1 otherwise no acute ST changes.  He tells me that the chest pain started after he started vomiting.  The N/V resolved but he continued to have CP off and on with exertion for about a week after.  It was midsternal with radiation to the left and right of his chest.  He was working and says that he normally sweats when he works so hard to say if he had diaphoresis with the CP.  He still is having intermittent CP off and on.  He says that it lasts 30-40 seconds and resolves on its own.  It is a sharp stabbing pain.    Past Medical History:  Diagnosis Date   ADD (attention deficit disorder)    Asthma    mild    Heart murmur    "mild" no cardiologist   History of kidney stones    Migraine     Past Surgical History:  Procedure Laterality Date   KIDNEY STONE SURGERY  2016  or 2018   cystoscopy   LITHOTRIPSY  2016 or 2018    Current Medications: Current Meds  Medication Sig   albuterol (VENTOLIN HFA) 108 (90 Base) MCG/ACT inhaler Inhale 2 puffs into the lungs every 6 (six) hours as needed for wheezing or shortness of breath.   naproxen (NAPROSYN) 500 MG tablet Take 1 tablet (500 mg total) by mouth 2 (two) times daily as needed.   ondansetron (ZOFRAN) 4 MG tablet Take 4 mg by mouth every 6 (six) hours as needed for nausea/vomiting.   tamsulosin (FLOMAX) 0.4 MG CAPS capsule Take 1 capsule (0.4 mg total) by mouth daily.    Allergies:   Amoxicillin and Penicillins   Social History   Socioeconomic History   Marital status: Single    Spouse name: Not on file   Number of children: Not on file   Years of education: Not on file   Highest education level: Not on file  Occupational History   Not on file  Tobacco Use   Smoking status: Former    Packs/day: 1.00    Years: 10.00    Total pack years: 10.00    Types: Cigarettes  Quit date: 06/09/2008    Years since quitting: 13.8   Smokeless tobacco: Never  Vaping Use   Vaping Use: Every day   Substances: Nicotine  Substance and Sexual Activity   Alcohol use: Not Currently   Drug use: Yes    Types: Marijuana    Comment: marijuana occ last used 02-27-2020   Sexual activity: Not on file  Other Topics Concern   Not on file  Social History Narrative   Not on file   Social Determinants of Health   Financial Resource Strain: Not on file  Food Insecurity: No Food Insecurity (05/08/2019)   Hunger Vital Sign    Worried About Running Out of Food in the Last Year: Never true    Ran Out of Food in the Last Year: Never true  Transportation Needs: Not on file  Physical Activity: Not on file  Stress: Not on file  Social Connections: Not on file     Family History:  The patient's family history includes Hypertension in his father, mother, and sister.   ROS:   Please see the history of present illness.     ROS All other systems reviewed and are negative.      No data to display             PHYSICAL EXAM:   VS:  BP 108/68   Pulse 60   Ht 5\' 10"  (1.778 m)   Wt 126 lb (57.2 kg)   SpO2 99%   BMI 18.08 kg/m    GEN: Well nourished, well developed, in no acute distress  HEENT: normal  Neck: no JVD, carotid bruits, or masses Cardiac: RRR; no murmurs, rubs, or gallops,no edema.  Intact distal pulses bilaterally.  Respiratory:  clear to auscultation bilaterally, normal work of breathing GI: soft, nontender, nondistended, + BS MS: no deformity or atrophy  Skin: warm and dry, no rash Neuro:  Alert and Oriented x 3, Strength and sensation are intact Psych: euthymic mood, full affect  Wt Readings from Last 3 Encounters:  04/23/22 126 lb (57.2 kg)  04/18/22 122 lb 4.8 oz (55.5 kg)  05/20/20 130 lb (59 kg)    EKG today showed NSR with no ST changes  Studies/Labs Reviewed:   ER records, EKG, labs  Recent Labs: 04/18/2022: ALT 18; BUN 12; Creatinine, Ser 0.85; Hemoglobin 13.5; Platelets 291; Potassium 3.8; Sodium 139; TSH 0.989    Additional studies/ records that were reviewed today include:  ER records    ASSESSMENT:    1. Atypical chest pain      PLAN:  In order of problems listed above:  Atypical chest pain -this started after he what sound like a gastroenteritis -he started CP after the vomiting started -hs Trop in ER was normal x 2 and EKG nonischemic -N/V have stopped but now having intermittent exertional CP that is atypical and sharp with no significant radiation -his only CRF is remote tobacco use -recommend coronary CTA to define coronary anatomy -check 2D echo to assess LVF  Time Spent: 20 minutes total time of encounter, including 15 minutes spent in face-to-face patient care on the date of this encounter. This time includes coordination of care and counseling regarding above mentioned problem list. Remainder of non-face-to-face time involved reviewing  chart documents/testing relevant to the patient encounter and documentation in the medical record. I have independently reviewed documentation from referring provider  Medication Adjustments/Labs and Tests Ordered: Current medicines are reviewed at length with the patient today.  Concerns regarding medicines  are outlined above.  Medication changes, Labs and Tests ordered today are listed in the Patient Instructions below.  There are no Patient Instructions on file for this visit.   Signed, Armanda Magic, MD  04/23/2022 1:48 PM    [facility] [address] [place], [place], [place]  [address] Phone: [phone]; Fax: [phone]

## 2022-04-25 ENCOUNTER — Ambulatory Visit (HOSPITAL_COMMUNITY): Payer: 59 | Attending: Cardiology

## 2022-04-25 DIAGNOSIS — R0789 Other chest pain: Secondary | ICD-10-CM

## 2022-04-25 DIAGNOSIS — R072 Precordial pain: Secondary | ICD-10-CM | POA: Diagnosis not present

## 2022-04-25 LAB — ECHOCARDIOGRAM COMPLETE
Area-P 1/2: 2.87 cm2
S' Lateral: 2.9 cm

## 2022-04-25 MED ORDER — PERFLUTREN LIPID MICROSPHERE
1.0000 mL | INTRAVENOUS | Status: AC | PRN
  Administered 2022-04-25: 1 mL via INTRAVENOUS

## 2022-04-28 ENCOUNTER — Ambulatory Visit (HOSPITAL_COMMUNITY)
Admission: RE | Admit: 2022-04-28 | Discharge: 2022-04-28 | Disposition: A | Discharge status: Home / Self Care | Payer: 59 | Source: Ambulatory Visit | Attending: Cardiology | Admitting: Cardiology

## 2022-04-28 DIAGNOSIS — R072 Precordial pain: Secondary | ICD-10-CM | POA: Insufficient documentation

## 2022-04-28 MED ORDER — IOHEXOL 350 MG/ML SOLN
95.0000 mL | Freq: Once | INTRAVENOUS | Status: AC | PRN
  Administered 2022-04-28: 95 mL via INTRAVENOUS

## 2022-04-28 MED ORDER — NITROGLYCERIN 0.4 MG SL SUBL
SUBLINGUAL_TABLET | SUBLINGUAL | Status: AC
  Filled 2022-04-28: qty 2

## 2022-04-28 MED ORDER — NITROGLYCERIN 0.4 MG SL SUBL
0.8000 mg | SUBLINGUAL_TABLET | Freq: Once | SUBLINGUAL | Status: AC
  Administered 2022-04-28: 0.8 mg via SUBLINGUAL

## 2022-04-29 ENCOUNTER — Telehealth: Payer: Self-pay | Admitting: Cardiology

## 2022-04-29 ENCOUNTER — Encounter: Payer: Self-pay | Admitting: Cardiology

## 2022-04-29 NOTE — Telephone Encounter (Signed)
Pt would like a callback regarding a letter to return to work with no restrictions and full duty. Please advise

## 2022-05-05 NOTE — Telephone Encounter (Signed)
Left message for patient to call back regarding return to work release.

## 2022-05-06 NOTE — Telephone Encounter (Signed)
Left message for patient to return call.

## 2023-04-21 ENCOUNTER — Encounter (HOSPITAL_BASED_OUTPATIENT_CLINIC_OR_DEPARTMENT_OTHER): Payer: Self-pay | Admitting: Urology

## 2023-04-21 ENCOUNTER — Emergency Department (HOSPITAL_BASED_OUTPATIENT_CLINIC_OR_DEPARTMENT_OTHER)
Admission: EM | Admit: 2023-04-21 | Discharge: 2023-04-21 | Discharge status: Against Medical Advice | Payer: 59 | Attending: Emergency Medicine | Admitting: Emergency Medicine

## 2023-04-21 DIAGNOSIS — Z5321 Procedure and treatment not carried out due to patient leaving prior to being seen by health care provider: Secondary | ICD-10-CM | POA: Diagnosis not present

## 2023-04-21 DIAGNOSIS — W57XXXA Bitten or stung by nonvenomous insect and other nonvenomous arthropods, initial encounter: Secondary | ICD-10-CM | POA: Insufficient documentation

## 2023-04-21 DIAGNOSIS — R109 Unspecified abdominal pain: Secondary | ICD-10-CM | POA: Insufficient documentation

## 2023-04-21 DIAGNOSIS — R0602 Shortness of breath: Secondary | ICD-10-CM | POA: Diagnosis not present

## 2023-04-21 DIAGNOSIS — S60861A Insect bite (nonvenomous) of right wrist, initial encounter: Secondary | ICD-10-CM | POA: Diagnosis not present

## 2023-04-21 DIAGNOSIS — R21 Rash and other nonspecific skin eruption: Secondary | ICD-10-CM | POA: Diagnosis present

## 2023-04-21 NOTE — ED Triage Notes (Signed)
Pt states noticed bug bite to right wrist 2 weeks ago  States had rash and burning feeling to right arm that started 1 week ago Rash now going up arm and onto neck  States slight SOB over past 2 weeks since bite as well  Also state right sided flank pain that started today   Taking benadryl with no relief
